# Patient Record
Sex: Female | Born: 1965 | Race: White | Hispanic: No | Marital: Married | Smoking: Former smoker
Health system: Southern US, Community
[De-identification: ages and names within clinical notes are randomized; demographics above are authoritative.]

## PROBLEM LIST (undated history)

## (undated) DIAGNOSIS — M1990 Unspecified osteoarthritis, unspecified site: Secondary | ICD-10-CM

## (undated) DIAGNOSIS — I10 Essential (primary) hypertension: Secondary | ICD-10-CM

## (undated) DIAGNOSIS — J449 Chronic obstructive pulmonary disease, unspecified: Secondary | ICD-10-CM

## (undated) HISTORY — PX: HIATAL HERNIA REPAIR: SHX195

## (undated) HISTORY — PX: LUNG SURGERY: SHX703

## (undated) HISTORY — PX: TENDON REPAIR: SHX5111

## (undated) HISTORY — PX: CHOLECYSTECTOMY: SHX55

---

## 2006-11-15 ENCOUNTER — Emergency Department (HOSPITAL_COMMUNITY): Admission: EM | Admit: 2006-11-15 | Discharge: 2006-11-15 | Payer: Self-pay | Admitting: Emergency Medicine

## 2006-11-20 ENCOUNTER — Emergency Department (HOSPITAL_COMMUNITY): Admission: EM | Admit: 2006-11-20 | Discharge: 2006-11-20 | Payer: Self-pay | Admitting: Emergency Medicine

## 2007-01-15 ENCOUNTER — Inpatient Hospital Stay (HOSPITAL_COMMUNITY): Admission: EM | Admit: 2007-01-15 | Discharge: 2007-01-16 | Payer: Self-pay | Admitting: Emergency Medicine

## 2007-02-28 ENCOUNTER — Emergency Department (HOSPITAL_COMMUNITY): Admission: EM | Admit: 2007-02-28 | Discharge: 2007-02-28 | Payer: Self-pay | Admitting: Emergency Medicine

## 2007-03-07 ENCOUNTER — Ambulatory Visit: Payer: Self-pay | Admitting: Cardiothoracic Surgery

## 2007-03-07 ENCOUNTER — Encounter: Admission: RE | Admit: 2007-03-07 | Discharge: 2007-03-07 | Payer: Self-pay | Admitting: Cardiothoracic Surgery

## 2007-03-14 ENCOUNTER — Encounter: Admission: RE | Admit: 2007-03-14 | Discharge: 2007-03-14 | Payer: Self-pay | Admitting: Cardiothoracic Surgery

## 2007-03-18 ENCOUNTER — Ambulatory Visit: Payer: Self-pay | Admitting: Cardiothoracic Surgery

## 2007-03-28 ENCOUNTER — Encounter: Admission: RE | Admit: 2007-03-28 | Discharge: 2007-03-28 | Payer: Self-pay | Admitting: Cardiothoracic Surgery

## 2007-03-28 ENCOUNTER — Ambulatory Visit: Payer: Self-pay | Admitting: Cardiothoracic Surgery

## 2007-04-04 ENCOUNTER — Ambulatory Visit: Payer: Self-pay | Admitting: Cardiothoracic Surgery

## 2007-04-09 ENCOUNTER — Ambulatory Visit: Payer: Self-pay | Admitting: Cardiothoracic Surgery

## 2007-04-09 ENCOUNTER — Encounter (INDEPENDENT_AMBULATORY_CARE_PROVIDER_SITE_OTHER): Payer: Self-pay | Admitting: Specialist

## 2007-04-09 ENCOUNTER — Inpatient Hospital Stay (HOSPITAL_COMMUNITY): Admission: RE | Admit: 2007-04-09 | Discharge: 2007-04-14 | Payer: Self-pay | Admitting: Cardiothoracic Surgery

## 2007-04-18 ENCOUNTER — Encounter: Admission: RE | Admit: 2007-04-18 | Discharge: 2007-04-18 | Payer: Self-pay | Admitting: Cardiothoracic Surgery

## 2007-04-18 ENCOUNTER — Ambulatory Visit: Payer: Self-pay | Admitting: Cardiothoracic Surgery

## 2007-09-04 ENCOUNTER — Emergency Department (HOSPITAL_COMMUNITY): Admission: EM | Admit: 2007-09-04 | Discharge: 2007-09-04 | Payer: Self-pay | Admitting: Emergency Medicine

## 2008-01-15 ENCOUNTER — Ambulatory Visit: Payer: Self-pay | Admitting: Cardiothoracic Surgery

## 2009-07-13 ENCOUNTER — Ambulatory Visit (HOSPITAL_COMMUNITY): Admission: RE | Admit: 2009-07-13 | Discharge: 2009-07-13 | Payer: Self-pay | Admitting: Family Medicine

## 2010-04-28 ENCOUNTER — Emergency Department (HOSPITAL_COMMUNITY): Admission: EM | Admit: 2010-04-28 | Discharge: 2010-04-28 | Payer: Self-pay | Admitting: Emergency Medicine

## 2011-01-21 ENCOUNTER — Encounter: Payer: Self-pay | Admitting: Cardiothoracic Surgery

## 2011-05-18 NOTE — Op Note (Signed)
NAMEMarland Kitchen  Gabriella, Vance NO.:  [id number]   MEDICAL RECORD NO.:  [id number]          PATIENT TYPE:  INP   LOCATION:  2550                         FACILITY:  [facility]   PHYSICIAN:  Kerin Perna, M.D.  DATE OF BIRTH:  1966/03/27   DATE OF PROCEDURE:  04/09/2007  DATE OF DISCHARGE:                               OPERATIVE REPORT   OPERATION:  Right VATS (video assisted thoracoscopic surgery) with wedge  resection of apical blebs and right pleurectomy, placement of wound On-Q  system.   SURGEON:  Kerin Perna, M.D.   ASSISTANT:  Coral Ceo, PA-C.   ANESTHESIA:  General.   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   INDICATIONS FOR PROCEDURE:  The patient is a 45 year old female smoker  who has had recurrent pneumothoraces including hospitalization for chest  tube at [facility].  A CT scan showed extensive blood disease  in the right apex and she has had recent recurrent pneumothorax and  pain.  It was felt that a right VATS and resection of the blebs and  pleurectomy would be indicated.  I discussed the procedure in detail  with the patient and her husband including the alternatives to surgery,  the risks of prolonged air leak, bleeding, infection and recurrent  pneumothorax.  She understood these implications and agreed to proceed  with surgery.   PROCEDURE:  The patient was brought to the operating room, placed supine  on the operating table.  General anesthesia was induced.  A double-lumen  endotracheal tube was placed and positioned by the anesthesiologist.  The patient was turned to expose the right chest which was confirmed to  be the correct site with a preoperative time-out.  The chest was prepped  and draped as a sterile field.  Three VATS portal incisions were made  anteriorly, in the area  beneath the tip of the scapula, in the posterior  right thorax.  The camera was inserted.  The lung was carefully  inspected.  There was extensive bleb disease at the apex with some  adhesions.  The superior segment of the lower lobe and middle lobe had  no significant blood disease.  There were no nodules or densities noted  or pleural disease other than the adhesions at the apex.   The apical adhesions were taken down and cauterized.  The apex was  mobilized and using the Endo-GIA stapler, a generous wedge resection of  several apical blebs was performed.  The staple line was reinforced with  CoSeal adhesive.  A second area of more medial blood disease was excised  with a separate firing of the Endo-GIA stapler. The staple lines were  inspected and found to be intact.  The remainder of the lung was  inspected and found to have no other blebs.   A pleural abrasion and then a mechanical pleurectomy of the apex of the  fifth rib was undertaken with good results.   Next, a 28 chest tube was placed through a  separate incision and  advanced to the apex and secured to the skin under direct vision with a  camera.  Next the three VATS portal incisions were closed in layers  using Vicryl.  The patient was reversed from anesthesia, extubated and  returned to the recovery room.  The Pleur-Evac had no significant air  leak after the chest was closed.   A wound On-Q irrigation system was placed between the chest tube and the  VATS incisions from the anterior to the posterior chest and secured to  the skin with a 2-0 silk suture.  It was connected to the Marcaine  reservoir.      Kerin Perna, M.D.  Electronically Signed     PV/MEDQ  D:  04/09/2007  T:  04/09/2007  Job:  [id number]

## 2011-05-18 NOTE — Op Note (Signed)
Gabriella Vance, FICEK NO.:  [id number]   MEDICAL RECORD NO.:  [id number]          PATIENT TYPE:  OBV   LOCATION:  A330                          FACILITY:  [facility]   PHYSICIAN:  Barbaraann Barthel, M.D. DATE OF BIRTH:  [birth year]   DATE OF PROCEDURE:  01/14/2007  DATE OF DISCHARGE:                               OPERATIVE REPORT   DIAGNOSIS:  Right spontaneous pneumothorax.   PROCEDURE:  Placement of right 20-French chest tube in the mid axillary  line approximately the fifth intercostal space.   NOTE:  This is a 45 year old white female who was admitted through the  emergency room with a small 5% pneumothorax.  We elected to watch her  for approximately 4 hours, repeated the chest x-ray and there was  obvious increase in the pneumothorax.  We then under local anesthesia  prepped the right mid axillary line with Betadine solution, draped this  in the usual manner and used 1% Xylocaine without epinephrine in order  to place a chest tube.  This was done by cutdown anesthetizing the  intercostal space at approximately the fifth intercostal space and I  tunneled incision and tunneled the tube and placed this in through the  pleura.  This was done with minimal discomfort.  She had some pain of  obviously up on inspiration but that this procedure was tolerated very  well.  We then checked the chest x-ray, the tube had curved, it was  going cephalad but curved in the mediastinum. I then withdrew it  approximately 4 inches as appropriate according to the chest x-ray. We  then sutured the tube in place with a #1 Prolene suture and closed the  cutdown incision with 4-0 nylon.  Sterile dressing with Vaseline gauze  and large tape was applied and the tube was connected to Pleur-Evac and  this to continuous wall suction.  The patient tolerated the procedure  well.      Barbaraann Barthel, M.D.  Electronically Signed     WB/MEDQ  D:  01/14/2007  T:  01/15/2007  Job:  [id number]

## 2011-05-18 NOTE — Discharge Summary (Signed)
Gabriella Vance, PAE NO.:  [id number]   MEDICAL RECORD NO.:  [id number]          PATIENT TYPE:  INP   LOCATION:  A330                          FACILITY:  [facility]   PHYSICIAN:  Barbaraann Barthel, M.D. DATE OF BIRTH:  [birth year]   DATE OF ADMISSION:  01/14/2007  DATE OF DISCHARGE:  01/17/2008LH                               DISCHARGE SUMMARY   DIAGNOSIS:  Spontaneous pneumothorax.   SECONDARY DIAGNOSES:  1. Tobacco abuse.  2. Irregular heart rhythm.   PROCEDURES PERFORMED:  January 14, 2007:  Placement of 20-French chest  tube in right pleural cavity.   This is a 45 year old thin, heavy smoker, white female who presented to  the emergency room in respiratory distress; was noted to have a small  basilar pneumothorax on the right side.  This was observed for  approximately 4 hours.  A Followup chest x-ray revealed an increase in  the pneumothorax.  At this point, a 20-French chest tube was placed in  the emergency room and the patient was admitted for observation.  The  following day, a chest x-ray revealed resolution of the basal  pneumothorax on the right side and very small apical pneumothorax.  We  then kept her to wall suction another day and the following day this had  completely resolved.  Clinically, there was no air leak noted on the  pleurovac and the chest x-ray was read on January 16, 2007, as complete  resolution of the pneumothoraces.  I should state that the chest x-ray  showed pretty extensive blebs noted bilaterally.   The wound was cleaned and redressed after the removal of the chest tube  and the patient was discharged with instructions that she was excused  from heavy lifting and for work at present time.  I will follow her in a  week's time.  She is told to go to the emergency room either at [facility] or [facility].  If at [facility], to ask for Dr.  Edwyna Shell since we had discussed the possibility that she may be a future  candidate for stapling procedure of her pulmonary blebs.  At any rate,  she is told to report to the emergency room should she experience any  acute respiratory changes.   She was advised to stop smoking.  She was offered a prescription for  Chantix, she refused this.  She will follow up with [facility] if she should change her mind.  I will follow her for  suture removal and she is returned to her medical physicians at [facility].   LABORATORY DATA:  White count was 10.2 with an H&H of 14.7 and 42.4.  Her electrolytes were all grossly within normal limits.  Urinalysis was  also grossly within normal limits.  Chest x-rays as mentioned above.  Final x-ray showing resolution of right pneumothorax.   HOSPITAL COURSE:  As mentioned above, uneventful.  She was covered while  she was in the hospital while chest tube in place with daily Rocephin  and her pain medicines were controlled as needed with a  PCA pump.   DISCHARGE MEDICATIONS:  She is discharged on no pain medicines.  Take  Tylenol as needed for pain.   DISCHARGE INSTRUCTIONS:  The rest of her discharge instructions as  follows:  She is excused from work.  She is told to increase her  activity as tolerated.  She may go up and down the stairs.  She is told  to do no heavy lifting, no driving, no vigorous sexual activity.  She is  told to clean her wound daily with alcohol and apply Neosporin and 4x4  as needed.   DISCHARGE DIET:  Diet is without restriction.   DISCHARGE FOLLOWUP:  She is to report to my office on January 23, 2007,  at 10:00 a.m.  Told to go to the emergency room should she experience  any respiratory problems prior to this.  She is strongly encouraged to  stop smoking.   She is told to resume her preoperative medicines which include which  include atenolol 25 mg p.o. daily.  She also takes Allegra 180 mg p.r.n.  her problems with seasonal allergies.      Barbaraann Barthel,  M.D.  Electronically Signed     WB/MEDQ  D:  01/16/2007  T:  01/16/2007  Job:  [id number]   cc:   Barbaraann Barthel, M.D.  Fax: [phone]   Ines Bloomer, M.D.  [address]  [place]  [place] [address]   [facility]

## 2011-05-18 NOTE — H&P (Signed)
NAME:  Gabriella Vance, Gabriella Vance NO.:  [id number]   MEDICAL RECORD NO.:  [id number]          PATIENT TYPE:  OBV   LOCATION:  A330                          FACILITY:  [facility]   PHYSICIAN:  Barbaraann Barthel, M.D. DATE OF BIRTH:  18-Oct-1966   DATE OF ADMISSION:  01/14/2007  DATE OF DISCHARGE:  LH                              HISTORY & PHYSICAL   HISTORY AND PHYSICAL:  This is the first incidence of a pneumothorax in  this patient who smokes at least a pack of cigarettes per day and has  done so for many years.   PHYSICAL EXAMINATION:  VITAL SIGNS:  Temperature is 98.1.  Her blood  pressure as 110/60, pulse rate is between 51 at 67 beats per minute with  respirations 22 per minute.  Her O2 saturation has remained between 98  and 100% on 2 liters of nasal cannula oxygen.  She is 5 feet 10 inches  and weighs 125 pounds.  HEENT:  Head is normocephalic.  EYES:  Extraocular movements intact.  Pupils were round and react to light and accommodation.  There is no  conjunctive pallor or scleral injection.  Sclerae is normal tincture.  Nose and oral mucosa are moist.  Neck is supple and cylindrical without  jugular vein distension, thyromegaly or tracheal deviation.  There are  no bruits auscultated.  There is no adenopathy.  CHEST:  Diminished breath sounds and the patient is obviously stenting  on examination.  HEART:  Regular rhythm. Breast and axilla without masses.  ABDOMEN:  Soft.  The patient has previously undergone a laparoscopic  hiatal hernia repair with simultaneous laparoscopic gallbladder.  She  has had no other surgeries.  RECTAL:  PELVIC:  Examination was deferred.  EXTREMITIES:  Within normal limits.   REVIEW OF SYSTEMS:  OB/GYN SYSTEM:  The patient is a gravida 2, para 2,  abortus 0, cesarean 0 female with no family history of carcinoma of the  breast and she has had no mammogram.  GU:  No history of nephrolithiasis  or dysuria.  CARDIOVASCULAR:  The patient smokes at  least a pack of  cigarettes per day and takes Tenormin for an irregular heart beat.  She  does take Allegra for some seasonal allergies.  ENDOCRINE:  No history  diabetes or thyroid disease in the past.  MUSCULOSKELETAL:  Grossly  within normal limits.  NEURO:  System grossly within normal limits.   LABORATORY DATA:  Chest x-ray has shown as mentioned above the 5%  pneumothorax on admission which increased.   Her white count is 10.2 with an H&H of 14.7, 42.4.  Platelets of  277,000. Metabolic-7 is grossly within normal limits.  Urinalysis  grossly within normal limits.   MEDICATIONS:  1. Tenormin 25 mg daily.  2. Allegra 180 mg daily for her seasonal allergies.   ALLERGIES:  She has no known allergies to other medications and as  stated smokes a pack of cigarettes a day.   IMPRESSION:  Spontaneous pneumothorax first episode.   PLAN:  She has been admitted, #20-French chest tube was placed.  We will  follow  up with a chest x-ray in the morning.  I will cover with Rocephin  1 gram daily.  I have discussed this procedure with Dr. Edwyna Shell who may  very well end up seeing her in the future.      Barbaraann Barthel, M.D.  Electronically Signed     WB/MEDQ  D:  01/14/2007  T:  01/15/2007  Job:  [id number]   cc:   Ines Bloomer, M.D.  [address]  [place]  [place] [address]

## 2011-05-18 NOTE — Discharge Summary (Signed)
Gabriella Vance, Gabriella Vance NO.:  [id number]   MEDICAL RECORD NO.:  [id number]          PATIENT TYPE:  INP   LOCATION:  3305                         FACILITY:  [facility]   PHYSICIAN:  Kerin Perna, M.D.  DATE OF BIRTH:  1966-05-09   DATE OF ADMISSION:  04/09/2007  DATE OF DISCHARGE:                               DISCHARGE SUMMARY   ADMISSION DIAGNOSIS:  Right lung apical blebs with recurrent spontaneous  pneumothorax.   DISCHARGE/SECONDARY DIAGNOSES:  1. Right lung apical blebs with recurrent spontaneous pneumothorax      status post right VATS bleb resection and pleurectomy.  2. Postoperative right lung infiltrate improved with Zinacef.  3. Stable 15% right pneumothorax postoperatively.   PROCEDURE:  On April 09, 2007 right video-assisted thorascopic surgery  with wedge resection of apical blebs and right pleurectomy by Dr. Kathlee Nations Trigt.   ALLERGIES:  LATEX ALLERGY.   BRIEF HISTORY:  The patient is a 45 year old Caucasian female smoker who  had recurrent spontaneous right pneumothorax.  She initially presented  to [facility] in January and a chest tube was placed for a  pneumothorax.  She returned and was evaluated in the office in early  March where exam showed no evidence of pneumothorax as confirmed by  chest x-ray.  Ten days later she had recurrent pleuritic chest pain and  x-ray did show small pneumothorax on the right.  Subsequently, CT scan  was performed which showed bilateral apical blebs with some bilateral  pulmonary nodules measuring 3 mm, probably inflammatory.  Because of the  small recurrent pneumothorax and known blebs per CT scan, she was  scheduled for a resection of the blebs via right VATS procedure.   HOSPITAL COURSE:  Ms. Geraghty was electively admitted to [facility] on April 09, 2007.  She was taken to the operating room by Dr.  Donata Clay on April 09, 2007 for the above mentioned procedure.  Intraoperatively, an  _______On-Q___ catheter was inserted for pain  management and was discontinued by postoperative day 3.  She also was on  a Dilaudid PCA for pain management which was discontinued after her  chest tubes were out.  Pain was then controlled on Norco p.r.n.  Chest  tube output was minimal after surgery.  She did have a small air leak  initially managed with suction.  She also showed a 15% pneumothorax;  however, the air leak resolved and the chest x-ray remained stable  without suction and subsequently her chest tube was removed on April 13, 2007.  Her followup chest x-ray appeared stable.  Of note, she was  covered with four doses of Zinacef for right lung infiltrate which  appeared to be improving.  She remained afebrile with stable vital  signs.  Oxygen saturation was 98% on room air.  The patient was  tolerating food and making progress with mobility.  If her followup  chest x-ray appears stable on April 14th and there are no significant  changes in her status as anticipated, she will be discharged home on  postoperative day 5, April 14, 2007.  LABORATORY DATA:  Shows a hemoglobin of 9.6, hematocrit of 32.9,  platelet count 224.  Sodium 138, potassium 4.3, chloride 101, CO2 29,  BUN 5, creatinine 0.51, glucose 95.  LFTs were normal.  Pathology showed  benign lung parenchyma with subpleural blebs and emphysematous changes.   DISCHARGE MEDICATIONS:  1. Atenolol 25 mg p.o. daily.  2. Norco 5/325 mg 1-2 tablets p.o. q. 4 hours p.r.n. pain.   DISCHARGE INSTRUCTIONS:  She is to continue daily walking, breathing  exercises.  She should increase her activity slowly.  She may shower and  clean her incision gently with soap and water.  Should call if she  develops fever greater than 101, redness, or drainage from her incision  sites, or increased shortness of breath.  She should avoid driving or  heavy lifting for the next two weeks and should see Dr. Donata Clay at  [facility] office in 1-2 weeks  with a followup chest x-ray.  Our office will  contact her regarding specific appointment date and time.      Jerold Coombe, P.A.      Kerin Perna, M.D.  Electronically Signed    AWZ/MEDQ  D:  04/13/2007  T:  04/13/2007  Job:  [id number]   cc:   Kerin Perna, M.D.

## 2011-05-18 NOTE — Consult Note (Signed)
Gabriella Vance, Gabriella Vance NO.:  [id number]   MEDICAL RECORD NO.:  [id number]          PATIENT TYPE:  INP   LOCATION:  A330                          FACILITY:  [facility]   PHYSICIAN:  Barbaraann Barthel, M.D. DATE OF BIRTH:  03/19/1966   DATE OF CONSULTATION:  01/14/2007  DATE OF DISCHARGE:                                 CONSULTATION   EMERGENCY ROOM CONSULTATION:   NOTE:  Surgery was asked to see this 45 year old white female with  sudden onset of chest pain.  She came to the emergency room with those  complaints and some pain on inspiration.  A chest x-ray showed a 5%  small pneumothorax that was loculated in the right basal area.  She was  otherwise stable.  Her blood pressure was 110/60, heart rate 51,  respirations 22 and her O2 saturation was 100% on 2 liters of nasal  cannula.   CONTRIBUTING PAST MEDICAL HISTORY:  She is a heavy smoker, smokes over a  pack of cigarettes per day and has done so for many years, and this is  her first instance of a pneumothorax.  Chest x-ray were also reveals  blebs present in the apical areas of the left chest, as well.   I have discussed this with the radiologist, as well as Dr. Edwyna Shell, and  we agree that with an observation in this patient is warranted.  We  obtained the first chest x-ray around 11 o'clock; we will obtain another  one at 3 o'clock, and if this is unchanged, we will have the patient  return to the emergency room if there are any acute respiratory changes  or and in the a.m. for a follow-up chest x-ray.  If this increases, I  will put a chest tube in and observe this and likely make a referral to  Dr. Edwyna Shell if this does not improve with this treatment.      Barbaraann Barthel, M.D.  Electronically Signed     WB/MEDQ  D:  01/14/2007  T:  01/14/2007  Job:  [id number]   cc:   Ines Bloomer, M.D.  [address]  [place]  [place] [address]
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
## 2012-12-02 ENCOUNTER — Emergency Department (HOSPITAL_COMMUNITY)
Admission: EM | Admit: 2012-12-02 | Discharge: 2012-12-02 | Disposition: A | Discharge status: Home / Self Care | Payer: Self-pay | Attending: Emergency Medicine | Admitting: Emergency Medicine

## 2012-12-02 ENCOUNTER — Encounter (HOSPITAL_COMMUNITY): Payer: Self-pay | Admitting: *Deleted

## 2012-12-02 ENCOUNTER — Emergency Department (HOSPITAL_COMMUNITY): Payer: Self-pay

## 2012-12-02 DIAGNOSIS — Z7982 Long term (current) use of aspirin: Secondary | ICD-10-CM | POA: Insufficient documentation

## 2012-12-02 DIAGNOSIS — R509 Fever, unspecified: Secondary | ICD-10-CM | POA: Insufficient documentation

## 2012-12-02 DIAGNOSIS — Z87891 Personal history of nicotine dependence: Secondary | ICD-10-CM | POA: Insufficient documentation

## 2012-12-02 DIAGNOSIS — J029 Acute pharyngitis, unspecified: Secondary | ICD-10-CM | POA: Insufficient documentation

## 2012-12-02 DIAGNOSIS — I10 Essential (primary) hypertension: Secondary | ICD-10-CM | POA: Insufficient documentation

## 2012-12-02 DIAGNOSIS — IMO0001 Reserved for inherently not codable concepts without codable children: Secondary | ICD-10-CM | POA: Insufficient documentation

## 2012-12-02 DIAGNOSIS — J3489 Other specified disorders of nose and nasal sinuses: Secondary | ICD-10-CM | POA: Insufficient documentation

## 2012-12-02 DIAGNOSIS — J40 Bronchitis, not specified as acute or chronic: Secondary | ICD-10-CM | POA: Insufficient documentation

## 2012-12-02 DIAGNOSIS — Z9889 Other specified postprocedural states: Secondary | ICD-10-CM | POA: Insufficient documentation

## 2012-12-02 DIAGNOSIS — Z79899 Other long term (current) drug therapy: Secondary | ICD-10-CM | POA: Insufficient documentation

## 2012-12-02 HISTORY — DX: Essential (primary) hypertension: I10

## 2012-12-02 MED ORDER — ALBUTEROL SULFATE HFA 108 (90 BASE) MCG/ACT IN AERS
2.0000 | INHALATION_SPRAY | Freq: Once | RESPIRATORY_TRACT | Status: AC
  Administered 2012-12-02: 2 via RESPIRATORY_TRACT
  Filled 2012-12-02: qty 6.7

## 2012-12-02 MED ORDER — AZITHROMYCIN 250 MG PO TABS
500.0000 mg | ORAL_TABLET | Freq: Once | ORAL | Status: AC
  Administered 2012-12-02: 500 mg via ORAL
  Filled 2012-12-02: qty 2

## 2012-12-02 MED ORDER — AZITHROMYCIN 250 MG PO TABS: ORAL_TABLET | ORAL | Status: DC

## 2012-12-02 MED ORDER — GUAIFENESIN-CODEINE 100-10 MG/5ML PO SYRP: 10.0000 mL | ORAL_SOLUTION | Freq: Three times a day (TID) | ORAL | Status: AC | PRN

## 2012-12-02 NOTE — ED Notes (Signed)
Cough, fever, runny nose, sore throat, sputum is yellow,Body aches

## 2012-12-03 NOTE — ED Provider Notes (Signed)
History     CSN: [id number]  Arrival date & time 12/02/12  1404   First MD Initiated Contact with Patient 12/02/12 1438      Chief Complaint  Patient presents with  . Cough    (Consider location/radiation/quality/duration/timing/severity/associated sxs/prior treatment) Patient is a 46 y.o. female presenting with cough. The history is provided by the patient.  Cough This is a new problem. The current episode started more than 2 days ago. The problem occurs constantly. The problem has been gradually worsening. The cough is productive of purulent sputum. The maximum temperature recorded prior to her arrival was 100 to 100.9 F. The fever has been present for 1 to 2 days. Associated symptoms include chills, sweats, rhinorrhea, sore throat and myalgias. Pertinent negatives include no chest pain, no ear pain, no headaches, no shortness of breath and no wheezing. She has tried nothing for the symptoms. The treatment provided no relief. Smoker: former smoker. Her past medical history does not include pneumonia or asthma. Past medical history comments: previous lung surgery.    Past Medical History  Diagnosis Date  . Hypertension     Past Surgical History  Procedure Date  . Lung surgery     History reviewed. No pertinent family history.  History  Substance Use Topics  . Smoking status: Former Games developer  . Smokeless tobacco: Not on file  . Alcohol Use: No    OB History    Grav Para Term Preterm Abortions TAB SAB Ect Mult Living                  Review of Systems  Constitutional: Positive for fever and chills. Negative for activity change and appetite change.  HENT: Positive for congestion, sore throat and rhinorrhea. Negative for ear pain, facial swelling, trouble swallowing, neck pain and neck stiffness.   Eyes: Negative for visual disturbance.  Respiratory: Positive for cough. Negative for chest tightness, shortness of breath, wheezing and stridor.   Cardiovascular: Negative for  chest pain.  Gastrointestinal: Negative for nausea and vomiting.  Genitourinary: Negative for dysuria.  Musculoskeletal: Positive for myalgias.  Skin: Negative.  Negative for rash.  Neurological: Negative for dizziness, weakness, numbness and headaches.  Hematological: Negative for adenopathy.  Psychiatric/Behavioral: Negative for confusion.  All other systems reviewed and are negative.    Allergies  Latex  Home Medications   Current Outpatient Rx  Name  Route  Sig  Dispense  Refill  . ACETAMINOPHEN 500 MG PO TABS   Oral   Take 1,000 mg by mouth every 6 (six) hours as needed. Pain         . ASPIRIN EC 81 MG PO TBEC   Oral   Take 81 mg by mouth daily.         . ATENOLOL 25 MG PO TABS   Oral   Take 25 mg by mouth daily.         Marland Kitchen DEXTROMETHORPHAN HBR 15 MG/5ML PO SYRP   Oral   Take 15 mLs by mouth 4 (four) times daily as needed. Cough         . IBUPROFEN 200 MG PO TABS   Oral   Take 600 mg by mouth every 6 (six) hours as needed. Pain         . ADULT MULTIVITAMIN W/MINERALS CH   Oral   Take 1 tablet by mouth daily.         . AZITHROMYCIN 250 MG PO TABS      Take two  tablets on day one, then one tab qd days 2-5   6 tablet   0   . GUAIFENESIN-CODEINE 100-10 MG/5ML PO SYRP   Oral   Take 10 mLs by mouth 3 (three) times daily as needed for cough.   120 mL   0     BP 105/88  Pulse 96  Temp 99.5 F (37.5 C) (Oral)  Resp 20  Ht 5\' 10"  (1.778 m)  Wt 180 lb (81.647 kg)  BMI 25.83 kg/m2  SpO2 98%  Physical Exam  Nursing note and vitals reviewed. Constitutional: She is oriented to person, place, and time. She appears well-developed and well-nourished. No distress.  HENT:  Head: Normocephalic and atraumatic.  Mouth/Throat: Oropharynx is clear and moist.  Neck: Normal range of motion. Neck supple.  Cardiovascular: Normal rate, regular rhythm, normal heart sounds and intact distal pulses.   No murmur heard. Pulmonary/Chest: Effort normal and  breath sounds normal. No respiratory distress. She has no rales.       Few expiratory wheezes and carse lung sounds bilaterally.  No rales   Abdominal: Soft.  Musculoskeletal: She exhibits no edema.  Neurological: She is alert and oriented to person, place, and time. She exhibits normal muscle tone. Coordination normal.    ED Course  Procedures (including critical care time)  Labs Reviewed - No data to display Dg Chest 2 View  12/02/2012  *RADIOLOGY REPORT*  Clinical Data: Cough  CHEST - 2 VIEW  Comparison: 09/04/2007  Findings: Cardiomediastinal silhouette is stable.  Postsurgical changes in the right upper lobe.  No acute infiltrate or pleural effusion.  No pulmonary edema.  Bony thorax is unremarkable.  IMPRESSION: No active disease.  Stable postsurgical changes in the right upper lobe.   Original Report Authenticated By: Natasha Mead, M.D.      1. Bronchitis       MDM     Vitals are stable,  Lung sounds improved after neb, pt feeling better.  Non-toxic appearing.  No tachypnea, tachycardia or hypoxia to suggest PE.   Albuterol MDI dispensed in ED and given first dose of Zithromax.  Pt agrees to f/u with her PMD or return here if sx's worsen.  prescribed   Robitussin AC zithromax    Mika Griffitts L. [place], [place] 12/03/12 2133

## 2012-12-05 NOTE — ED Provider Notes (Signed)
Medical screening examination/treatment/procedure(s) were performed by non-physician practitioner and as supervising physician I was immediately available for consultation/collaboration. Kal Chait, MD, FACEP   Rashea Hoskie L Okechukwu Regnier, MD 12/05/12 2119 

## 2013-09-10 ENCOUNTER — Emergency Department (HOSPITAL_COMMUNITY)
Admission: EM | Admit: 2013-09-10 | Discharge: 2013-09-10 | Disposition: A | Discharge status: Home / Self Care | Payer: Self-pay | Attending: Emergency Medicine | Admitting: Emergency Medicine

## 2013-09-10 ENCOUNTER — Encounter (HOSPITAL_COMMUNITY): Payer: Self-pay | Admitting: Adult Health

## 2013-09-10 DIAGNOSIS — Z87891 Personal history of nicotine dependence: Secondary | ICD-10-CM | POA: Insufficient documentation

## 2013-09-10 DIAGNOSIS — Z91040 Latex allergy status: Secondary | ICD-10-CM | POA: Insufficient documentation

## 2013-09-10 DIAGNOSIS — Z79899 Other long term (current) drug therapy: Secondary | ICD-10-CM | POA: Insufficient documentation

## 2013-09-10 DIAGNOSIS — I10 Essential (primary) hypertension: Secondary | ICD-10-CM | POA: Insufficient documentation

## 2013-09-10 DIAGNOSIS — L0100 Impetigo, unspecified: Secondary | ICD-10-CM | POA: Insufficient documentation

## 2013-09-10 DIAGNOSIS — Z792 Long term (current) use of antibiotics: Secondary | ICD-10-CM | POA: Insufficient documentation

## 2013-09-10 DIAGNOSIS — Z7982 Long term (current) use of aspirin: Secondary | ICD-10-CM | POA: Insufficient documentation

## 2013-09-10 MED ORDER — MUPIROCIN CALCIUM 2 % EX CREA: TOPICAL_CREAM | Freq: Three times a day (TID) | CUTANEOUS | Status: DC

## 2013-09-10 NOTE — ED Provider Notes (Signed)
CSN: [id number]     Arrival date & time 09/10/13  1503 History   This chart was scribed for non-physician practitioner Magnus Sinning, PA-C,working with Glynn Octave, MD by Dorothey Baseman, ED Scribe. This patient was seen in room TR06C/TR06C and the patient's care was started at 5:12 PM.    Chief Complaint  Patient presents with  . Rash    The history is provided by the patient. No language interpreter was used.   HPI Comments: Gabriella Vance is a 47 y.o. female who presents to the Emergency Department complaining of a rash that began on the right side of the upper lip onset 5 days ago that has since spread to her forehead and nose with associated soreness, but without drainage. She states she has washed the area with salt water at home, which seems to aggravate the area, making it more itchy. She states the rash appears to be drying up and improving at this time. [facility]  Patient reports sore throat secondary to a recent diagnosis of strep throat that has been improving after being seen at a clinic and was given a prescription of  Doxycycline, and Amoxicillin. Patient denies associated fever, chills, nausea, vomiting, or any other symptoms at this time.   Past Medical History  Diagnosis Date  . Hypertension    Past Surgical History  Procedure Laterality Date  . Lung surgery     History reviewed. No pertinent family history. History  Substance Use Topics  . Smoking status: Former Games developer  . Smokeless tobacco: Not on file  . Alcohol Use: No   OB History   Grav Para Term Preterm Abortions TAB SAB Ect Mult Living                 Review of Systems  A complete 10 system review of systems was obtained and all systems are negative except as noted in the HPI and PMH.   Allergies  Latex  Home Medications   Current Outpatient Rx  Name  Route  Sig  Dispense  Refill  . acetaminophen (TYLENOL) 500 MG tablet   Oral    Take 1,000 mg by mouth every 6 (six) hours as needed. Pain         . amoxicillin (AMOXIL) 875 MG tablet   Oral   Take 875 mg by mouth 2 (two) times daily.         Marland Kitchen aspirin EC 81 MG tablet   Oral   Take 81 mg by mouth daily.         Marland Kitchen atenolol (TENORMIN) 25 MG tablet   Oral   Take 25 mg by mouth daily.         Marland Kitchen doxycycline (VIBRA-TABS) 100 MG tablet   Oral   Take 100 mg by mouth 2 (two) times daily.         Marland Kitchen ibuprofen (ADVIL,MOTRIN) 200 MG tablet   Oral   Take 600 mg by mouth every 6 (six) hours as needed. Pain         . Multiple Vitamin (MULTIVITAMIN WITH MINERALS) TABS   Oral   Take 1 tablet by mouth daily.         . valACYclovir (VALTREX) 1000 MG tablet   Oral   Take 1,000 mg by mouth 2 (two) times daily.         Marland Kitchen azithromycin (ZITHROMAX Z-PAK) 250 MG tablet  Take two tablets on day one, then one tab qd days 2-5   6 tablet   0    Triage Vitals: BP 162/76  Pulse 105  Temp(Src) 98.7 F (37.1 C) (Oral)  Resp 16  SpO2 99%  Physical Exam  Nursing note and vitals reviewed. Constitutional: She is oriented to person, place, and time. She appears well-developed and well-nourished. No distress.  HENT:  Head: Normocephalic and atraumatic.  Mouth/Throat: Oropharynx is clear and moist.  Eyes: Conjunctivae are normal. Pupils are equal, round, and reactive to light.  Neck: Normal range of motion. Neck supple.  Cardiovascular: Normal rate, regular rhythm and normal heart sounds.   Pulmonary/Chest: Effort normal and breath sounds normal. No respiratory distress.  Musculoskeletal: Normal range of motion.  Neurological: She is alert and oriented to person, place, and time.  Skin: Skin is warm and dry. Rash noted.  3 scabbed areas with honey-colored crusting on an erythematous base to the right naso-labia area, left upper nose medial to the left eyebrow, and on the left upper forehead.  Psychiatric: She has a normal mood and affect. Her behavior is  normal.    ED Course  Procedures (including critical care time)  DIAGNOSTIC STUDIES: Oxygen Saturation is 99% on room air, normal by my interpretation.    COORDINATION OF CARE: 5:18PM- Discussed possible diagnosis of Impetigo and will discharge patient with Bactroban topical antibiotic. Advised patient to follow up with her PCP if there are any new or worsening symptoms. Discussed treatment plan with patient at bedside and patient verbalized agreement.     Labs Review Labs Reviewed - No data to display Imaging Review No results found.  MDM  No diagnosis found. Patient presenting with a rash.  Rash most consistent with the appearance of Impetigo.  Patient given prescription for Bactroban.  Patient instructed to follow up with PCP if the rash does not improve.  I personally performed the services described in this documentation, which was scribed in my presence. The recorded information has been reviewed and is accurate.    Pascal Lux [place], PA-C 09/16/13 [phone]

## 2013-09-10 NOTE — ED Notes (Addendum)
Presents with sore to left side of upper lip, bridge of nose and right upper forehead, began as small vesicles and have increased in size. Recently started on amoxicillin, Doxycycline and Valacyclovir for a strep throat and respiratory virus on Monday. Sores are scabbed over. Reports throat feels better. Denies fever.  Rash does not hurt.

## 2013-09-16 NOTE — ED Provider Notes (Signed)
Medical screening examination/treatment/procedure(s) were performed by non-physician practitioner and as supervising physician I was immediately available for consultation/collaboration.   Glynn Octave, MD 09/16/13 [phone]

## 2014-08-24 ENCOUNTER — Emergency Department (HOSPITAL_COMMUNITY)
Admission: EM | Admit: 2014-08-24 | Discharge: 2014-08-24 | Disposition: A | Discharge status: Home / Self Care | Payer: Self-pay | Attending: Emergency Medicine | Admitting: Emergency Medicine

## 2014-08-24 ENCOUNTER — Emergency Department (HOSPITAL_COMMUNITY): Payer: Self-pay

## 2014-08-24 ENCOUNTER — Encounter (HOSPITAL_COMMUNITY): Payer: Self-pay | Admitting: Emergency Medicine

## 2014-08-24 DIAGNOSIS — R079 Chest pain, unspecified: Secondary | ICD-10-CM | POA: Insufficient documentation

## 2014-08-24 DIAGNOSIS — I10 Essential (primary) hypertension: Secondary | ICD-10-CM | POA: Insufficient documentation

## 2014-08-24 DIAGNOSIS — R059 Cough, unspecified: Secondary | ICD-10-CM | POA: Insufficient documentation

## 2014-08-24 DIAGNOSIS — Z7982 Long term (current) use of aspirin: Secondary | ICD-10-CM | POA: Insufficient documentation

## 2014-08-24 DIAGNOSIS — Z79899 Other long term (current) drug therapy: Secondary | ICD-10-CM | POA: Insufficient documentation

## 2014-08-24 DIAGNOSIS — Z87891 Personal history of nicotine dependence: Secondary | ICD-10-CM | POA: Insufficient documentation

## 2014-08-24 DIAGNOSIS — R05 Cough: Secondary | ICD-10-CM | POA: Insufficient documentation

## 2014-08-24 DIAGNOSIS — Z91040 Latex allergy status: Secondary | ICD-10-CM | POA: Insufficient documentation

## 2014-08-24 LAB — CBC WITH DIFFERENTIAL/PLATELET
BASOS ABS: 0 10*3/uL (ref 0.0–0.1)
BASOS PCT: 1 % (ref 0–1)
Eosinophils Absolute: 0.3 10*3/uL (ref 0.0–0.7)
Eosinophils Relative: 5 % (ref 0–5)
HEMATOCRIT: 37.6 % (ref 36.0–46.0)
HEMOGLOBIN: 12.8 g/dL (ref 12.0–15.0)
LYMPHS PCT: 25 % (ref 12–46)
Lymphs Abs: 1.5 10*3/uL (ref 0.7–4.0)
MCH: 32.1 pg (ref 26.0–34.0)
MCHC: 34 g/dL (ref 30.0–36.0)
MCV: 94.2 fL (ref 78.0–100.0)
MONO ABS: 0.5 10*3/uL (ref 0.1–1.0)
Monocytes Relative: 8 % (ref 3–12)
NEUTROS ABS: 3.7 10*3/uL (ref 1.7–7.7)
NEUTROS PCT: 61 % (ref 43–77)
Platelets: 273 10*3/uL (ref 150–400)
RBC: 3.99 MIL/uL (ref 3.87–5.11)
RDW: 12.4 % (ref 11.5–15.5)
WBC: 6 10*3/uL (ref 4.0–10.5)

## 2014-08-24 LAB — D-DIMER, QUANTITATIVE (NOT AT ARMC): D DIMER QUANT: 0.32 ug{FEU}/mL (ref 0.00–0.48)

## 2014-08-24 LAB — TROPONIN I

## 2014-08-24 LAB — BASIC METABOLIC PANEL
ANION GAP: 15 (ref 5–15)
BUN: 10 mg/dL (ref 6–23)
CHLORIDE: 99 meq/L (ref 96–112)
CO2: 27 mEq/L (ref 19–32)
Calcium: 9.7 mg/dL (ref 8.4–10.5)
Creatinine, Ser: 0.63 mg/dL (ref 0.50–1.10)
Glucose, Bld: 89 mg/dL (ref 70–99)
POTASSIUM: 4.2 meq/L (ref 3.7–5.3)
SODIUM: 141 meq/L (ref 137–147)

## 2014-08-24 MED ORDER — OXYCODONE-ACETAMINOPHEN 5-325 MG PO TABS: 1.0000 | ORAL_TABLET | Freq: Four times a day (QID) | ORAL | Status: DC | PRN

## 2014-08-24 NOTE — ED Notes (Signed)
Pt reports superior lung pain on left side since 3 days.  Pt reports SOB upon certain movements.  Pt had lobectomy on right side 9 years ago.

## 2014-08-24 NOTE — ED Provider Notes (Signed)
CSN: [id number]     Arrival date & time 08/24/14  1603 History   First MD Initiated Contact with Patient 08/24/14 1714     Chief Complaint  Patient presents with  . Cough     (Consider location/radiation/quality/duration/timing/severity/associated sxs/prior Treatment) Patient is a 48 y.o. female presenting with cough. The history is provided by the patient.  Cough Associated symptoms: chest pain   Associated symptoms: no headaches, no rash and no shortness of breath    patient has had left-sided chest pain for last 3 days. It is constant. It is worse with moving her left arm and with certain positions. No trauma. No cough. She states she's had a previous collapsed lung on the other side a she's worried that she has. She also has family members have had heart attacks in their 23s and 44s. No fevers. No abdominal pain. No nausea. No diaphoresis. She's a former smoker. She smokes one pack a day for 30 years. She's a chronic cough she states is unchanged. She states the pain is somewhat sharp and goes from her left chest to the back.  Past Medical History  Diagnosis Date  . Hypertension    Past Surgical History  Procedure Laterality Date  . Lung surgery    . Cholecystectomy    . Hiatal hernia repair    . Tendon repair     No family history on file. History  Substance Use Topics  . Smoking status: Former Games developer  . Smokeless tobacco: Not on file  . Alcohol Use: No   OB History   Grav Para Term Preterm Abortions TAB SAB Ect Mult Living                 Review of Systems  Constitutional: Negative for activity change and appetite change.  Eyes: Negative for pain.  Respiratory: Positive for cough. Negative for chest tightness and shortness of breath.   Cardiovascular: Positive for chest pain. Negative for leg swelling.  Gastrointestinal: Negative for nausea, vomiting, abdominal pain and diarrhea.  Genitourinary: Negative for flank pain.  Musculoskeletal: Negative for back pain and  neck stiffness.  Skin: Negative for rash.  Neurological: Negative for weakness, numbness and headaches.  Psychiatric/Behavioral: Negative for behavioral problems.      Allergies  Latex  Home Medications   Prior to Admission medications   Medication Sig Start Date End Date Taking? Authorizing Provider  aspirin EC 81 MG tablet Take 81 mg by mouth daily.   Yes Historical Provider, MD  atenolol (TENORMIN) 25 MG tablet Take 25 mg by mouth daily.   Yes Historical Provider, MD  Multiple Vitamin (MULTIVITAMIN WITH MINERALS) TABS Take 1 tablet by mouth daily.   Yes Historical Provider, MD  acetaminophen (TYLENOL) 500 MG tablet Take 1,000 mg by mouth every 6 (six) hours as needed. Pain    Historical Provider, MD  ibuprofen (ADVIL,MOTRIN) 200 MG tablet Take 600 mg by mouth every 6 (six) hours as needed. Pain    Historical Provider, MD  oxyCODONE-acetaminophen (PERCOCET/ROXICET) 5-325 MG per tablet Take 1-2 tablets by mouth every 6 (six) hours as needed for severe pain. 08/24/14   Juliet Rude. Benicia Bergevin, MD   BP 140/70  Pulse 89  Temp(Src) 98.2 F (36.8 C) (Oral)  Resp 20  Ht  (1.778 m)  Wt 180 lb (81.647 kg)  BMI 25.83 kg/m2  SpO2 97% Physical Exam  Nursing note and vitals reviewed. Constitutional: She is oriented to person, place, and time. She appears well-developed and well-nourished.  HENT:  Head: Normocephalic and atraumatic.  Eyes: EOM are normal. Pupils are equal, round, and reactive to light.  Neck: Normal range of motion. Neck supple.  Cardiovascular: Normal rate, regular rhythm and normal heart sounds.   No murmur heard. Pulmonary/Chest: Effort normal and breath sounds normal. No respiratory distress. She has no wheezes. She has no rales. She exhibits tenderness.  Moderate tenderness to left chest worse lower chest laterally. No rash. No Crepitance or deformity  Abdominal: Soft. Bowel sounds are normal. She exhibits no distension. There is no tenderness. There is no rebound  and no guarding.  Musculoskeletal: Normal range of motion.  Neurological: She is alert and oriented to person, place, and time. No cranial nerve deficit.  Skin: Skin is warm and dry.  Psychiatric: She has a normal mood and affect. Her speech is normal.    ED Course  Procedures (including critical care time) Labs Review Labs Reviewed  D-DIMER, QUANTITATIVE  CBC WITH DIFFERENTIAL  TROPONIN I  BASIC METABOLIC PANEL    Imaging Review Dg Chest 2 View  08/24/2014   CLINICAL DATA:  Cough. 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  EXAM: CHEST  2 VIEW  COMPARISON:  Two-view chest x-ray 12/02/2012, 09/04/2007, 04/18/2007. CT chest 07/13/2009.  FINDINGS: Cardiomediastinal silhouette unremarkable, unchanged. Bullous emphysematous changes in the upper lobes with surgical suture material in the right apex. Linear scarring in the lower lobes bilaterally. Mild central peribronchial thickening, unchanged. Lungs otherwise clear. No localized airspace consolidation. No pleural effusions. No pneumothorax. Normal pulmonary vascularity. Visualized bony thorax intact.  IMPRESSION: Severe COPD/emphysema. Scarring in the lower lobes. No acute cardiopulmonary disease.   Electronically Signed   By: Hulan Saas M.D.   On: 08/24/2014 16:40     EKG Interpretation   Date/Time:  Tuesday August 24 2014 17:52:34 EDT Ventricular Rate:  68 PR Interval:  142 QRS Duration: 64 QT Interval:  388 QTC Calculation: 413 R Axis:   78 Text Interpretation:  Sinus rhythm Probable left atrial enlargement ST  elev, probable normal early repol pattern Baseline wander in lead(s) I II  aVR aVL Confirmed by Rubin Payor  MD, Harrold Donath [phone]) on 08/24/2014 9:34:36 PM      Date: 08/24/2014  Rate: 68  Rhythm: normal sinus rhythm  QRS Axis: normal  Intervals: normal  ST/T Wave abnormalities: normal  Conduction Disutrbances:none  Narrative Interpretation:   Old EKG Reviewed: none available   MDM    Final diagnoses:  Chest pain, unspecified chest pain type    Patient with left-sided chest pain. Sharp and reproducible. Patient is however smoker. Negative d-dimer. X-ray reassuring. Patient is worried about pneumothorax, since she states she's had this before. X-ray does not show a pneumothorax. Doubt cardiac cause. Will discharge home. Patient was informed of the possibility of early zoster, although there is no rash at this time    Juliet Rude. Rubin Payor, MD 08/24/14 2135

## 2014-08-24 NOTE — Discharge Instructions (Signed)

## 2014-08-26 ENCOUNTER — Emergency Department (HOSPITAL_COMMUNITY): Payer: Self-pay

## 2014-08-26 ENCOUNTER — Emergency Department (HOSPITAL_COMMUNITY)
Admission: EM | Admit: 2014-08-26 | Discharge: 2014-08-26 | Disposition: A | Discharge status: Home / Self Care | Payer: Self-pay | Attending: Emergency Medicine | Admitting: Emergency Medicine

## 2014-08-26 ENCOUNTER — Encounter (HOSPITAL_COMMUNITY): Payer: Self-pay | Admitting: Emergency Medicine

## 2014-08-26 DIAGNOSIS — I10 Essential (primary) hypertension: Secondary | ICD-10-CM | POA: Insufficient documentation

## 2014-08-26 DIAGNOSIS — J441 Chronic obstructive pulmonary disease with (acute) exacerbation: Secondary | ICD-10-CM | POA: Insufficient documentation

## 2014-08-26 DIAGNOSIS — Z79899 Other long term (current) drug therapy: Secondary | ICD-10-CM | POA: Insufficient documentation

## 2014-08-26 DIAGNOSIS — Z7982 Long term (current) use of aspirin: Secondary | ICD-10-CM | POA: Insufficient documentation

## 2014-08-26 DIAGNOSIS — J449 Chronic obstructive pulmonary disease, unspecified: Secondary | ICD-10-CM

## 2014-08-26 DIAGNOSIS — R0789 Other chest pain: Secondary | ICD-10-CM

## 2014-08-26 DIAGNOSIS — Z87891 Personal history of nicotine dependence: Secondary | ICD-10-CM | POA: Insufficient documentation

## 2014-08-26 DIAGNOSIS — Z91040 Latex allergy status: Secondary | ICD-10-CM | POA: Insufficient documentation

## 2014-08-26 DIAGNOSIS — R0602 Shortness of breath: Secondary | ICD-10-CM | POA: Insufficient documentation

## 2014-08-26 DIAGNOSIS — R0600 Dyspnea, unspecified: Secondary | ICD-10-CM

## 2014-08-26 LAB — BASIC METABOLIC PANEL
ANION GAP: 15 (ref 5–15)
BUN: 14 mg/dL (ref 6–23)
CHLORIDE: 101 meq/L (ref 96–112)
CO2: 24 mEq/L (ref 19–32)
Calcium: 9.8 mg/dL (ref 8.4–10.5)
Creatinine, Ser: 0.63 mg/dL (ref 0.50–1.10)
Glucose, Bld: 84 mg/dL (ref 70–99)
POTASSIUM: 4.3 meq/L (ref 3.7–5.3)
SODIUM: 140 meq/L (ref 137–147)

## 2014-08-26 LAB — PRO B NATRIURETIC PEPTIDE: PRO B NATRI PEPTIDE: 56.9 pg/mL (ref 0–125)

## 2014-08-26 LAB — TROPONIN I

## 2014-08-26 MED ORDER — IOHEXOL 350 MG/ML SOLN
100.0000 mL | Freq: Once | INTRAVENOUS | Status: AC | PRN
  Administered 2014-08-26: 100 mL via INTRAVENOUS

## 2014-08-26 MED ORDER — ALBUTEROL SULFATE HFA 108 (90 BASE) MCG/ACT IN AERS: 1.0000 | INHALATION_SPRAY | Freq: Four times a day (QID) | RESPIRATORY_TRACT | Status: DC | PRN

## 2014-08-26 NOTE — ED Notes (Addendum)
Pt states she has increased SOB, unexplained weight gain of 10lbs per pt. Pt was seen in ER 2 days ago for CP and SOB. The pt states that she feels pain in chest on deep inspiration.

## 2014-08-26 NOTE — ED Notes (Signed)
Ambulated patient around nursing station. Oxygen saturation was 98 percent. She states that she is feeling about the same as when she came in. Some coughing upon returning to room. States that she was feeling a little short of breath nothing different.

## 2014-08-26 NOTE — ED Provider Notes (Signed)
CSN: [id number]     Arrival date & time 08/26/14  1259 History  This chart was scribed for Joya Gaskins, MD, by Yevette Edwards, ED Scribe. This patient was seen in room APA08/[facility]08 and the patient's care was started at 1:44 PM.   First MD Initiated Contact with Patient 08/26/14 1341     Chief Complaint  Patient presents with  . Shortness of Breath   Patient is a 48 y.o. female presenting with chest pain. The history is provided by the patient. No language interpreter was used.  Chest Pain Pain location:  L chest Pain radiates to:  Upper back Pain radiates to the back: yes   Pain severity:  Severe Onset quality:  Unable to specify Duration:  5 days Timing:  Intermittent Progression:  Unchanged Chronicity:  New Context: breathing, lifting, movement and raising an arm   Relieved by:  Certain positions (Ambulating) Worsened by:  Certain positions, deep breathing and movement Associated symptoms: cough and shortness of breath   Associated symptoms: no abdominal pain   Risk factors: hypertension, obesity and smoking     Gabriella Vance is a 48 y.o. female, with a h/o HTN, who presents to the Emergency Department complaining of intermittent left-sided chest pain which began four days ago when she was at home. She reports the chest pain is also to her lateral ribs and shoulder blades. The pain is increased when she is recumbent, with deep inspiration, or with transitions from sitting to laying down. She also states the pain is increased with moving her arms as it creates a "pulling" sensation. She states that walking improves the pain. She also states she has slept in her recliner for the past four nights to alleviate the chest pain and associated SOB which she characterizes as "Dumbo sitting on my chest."  Gabriella Vance also reports a cough, congestion, and unexpected weight gain of ten pounds within two days. She states the cough and congestion improve throughout the day. She denies recent  lower extremity swelling increased from baseline, abdominal pain, or a rash. She states she has had multiple right lung issues. She has not had any lung problems in five years. She denies a h/o PE, emboli, or MI. When the pt was treated in the ED two days ago for similar symptoms she was assessed for a PE and emboli, both of which were normal.  She is a former smoker.  Past Medical History  Diagnosis Date  . Hypertension    Past Surgical History  Procedure Laterality Date  . Lung surgery    . Cholecystectomy    . Hiatal hernia repair    . Tendon repair     History reviewed. No pertinent family history. History  Substance Use Topics  . Smoking status: Former Games developer  . Smokeless tobacco: Not on file  . Alcohol Use: No   No OB history provided.  Review of Systems  HENT: Positive for congestion.   Respiratory: Positive for cough and shortness of breath.   Cardiovascular: Positive for chest pain. Negative for leg swelling.  Gastrointestinal: Negative for abdominal pain.  Skin: Negative for rash.  All other systems reviewed and are negative.   Allergies  Latex  Home Medications   Prior to Admission medications   Medication Sig Start Date End Date Taking? Authorizing Provider  acetaminophen (TYLENOL) 500 MG tablet Take 1,000 mg by mouth every 6 (six) hours as needed. Pain   Yes Historical Provider, MD  aspirin EC 81 MG tablet  Take 81 mg by mouth daily.   Yes Historical Provider, MD  atenolol (TENORMIN) 25 MG tablet Take 25 mg by mouth daily.   Yes Historical Provider, MD  FIBER SELECT GUMMIES PO Take 2 tablets by mouth daily.   Yes Historical Provider, MD  Multiple Vitamin (MULTIVITAMIN WITH MINERALS) TABS Take 1 tablet by mouth daily.   Yes Historical Provider, MD   Triage Vitals: BP 148/74  Pulse 84  Temp(Src) 97.8 F (36.6 C) (Oral)  Resp 20  Wt 196 lb 7 oz (89.103 kg)  SpO2 100%  Physical Exam  Constitutional: well developed, well nourished, no distress Head:  normocephalic/atraumatic Eyes: EOMI/PERRL ENMT: mucous membranes moist Neck: supple, no meningeal signs CV: no murmur/rubs/gallops noted; mild tenderness to left chest no crepitus Lungs: clear to auscultation bilaterally but she appears mildly tachypneic Abd: soft, nontender Extremities: full ROM noted, pulses normal/equal; no lower extremity edema or tenderness Neuro: awake/alert, no distress, appropriate for age, [id number]. Skin:   Color normal.  Warm Psych: appropriate for age  ED Course  Procedures  DIAGNOSTIC STUDIES: Oxygen Saturation is 100% on room air, normal by my interpretation.    COORDINATION OF CARE:  1:56 PM- Discussed treatment plan with patient, and the patient agreed to the plan.  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  I advised f/u with pulmonology Stable lung nodule noted on today's CT imaging She no longer takes albuterol this was prescribed at discharge Labs Review Labs Reviewed  BASIC METABOLIC PANEL  TROPONIN I  PRO B NATRIURETIC PEPTIDE    Imaging Review Ct Angio Chest Pe W/cm &/or Wo Cm  08/26/2014   CLINICAL DATA:  chest pain, shortness of Breath, hypertension  EXAM: CT ANGIOGRAPHY CHEST WITH CONTRAST  TECHNIQUE: Multidetector CT imaging of the chest was performed using the standard protocol during bolus administration of intravenous contrast. Multiplanar CT image reconstructions and MIPs were obtained to evaluate the vascular anatomy.  CONTRAST:  OMNIPAQUE IOHEXOL 350 MG/ML SOLN   COMPARISON:  07/13/2009  FINDINGS: Satisfactory opacification of pulmonary arteries noted, and there is no evidence of pulmonary emboli. Adequate contrast opacification of the thoracic aorta with no evidence of dissection, aneurysm, or stenosis. There is bovine variant brachiocephalic arch anatomy without proximal stenosis. No pleural or pericardial effusion. No hilar or mediastinal adenopathy. Large biapical blebs in diffuse emphysematous will changes most marked in the upper lobes. There is some subsegmental atelectasis in the posterior right lower lobe and posterior medial left lower lobe. 5 mm nodule adjacent to the minor fissure image 49/6 , stable. No new nodule. Thoracic spine and sternum intact. Visualized portions of upper abdomen unremarkable.  Review of the MIP images confirms the above findings.  IMPRESSION: 1. Negative for acute PE or thoracic aortic dissection. 2. Stable small right lung nodule and changes of bullous emphysema.   Electronically Signed   By: Oley Balm M.D.   On: 08/26/2014 15:21     EKG Interpretation   Date/Time:  Thursday August 26 2014 14:08:06 EDT Ventricular Rate:  72 PR Interval:  125 QRS Duration: 89 QT Interval:  412 QTC  Calculation: 451 R Axis:   72 Text Interpretation:  Sinus rhythm T wave inversion in aVL No significant  change since last tracing Confirmed by Bebe Shaggy  MD, Dorinda Hill ([id number]) on  08/26/2014 2:26:45 PM      MDM   Final diagnoses:  Dyspnea  Chest wall pain  Chronic obstructive pulmonary disease, unspecified COPD, unspecified chronic bronchitis type    Nursing notes including past medical history and social history reviewed and considered in documentation Labs/vital reviewed and considered Previous records reviewed and considered   I personally performed the services described in this documentation, which was scribed in my presence. The recorded information has been reviewed and is accurate.       Joya Gaskins,  MD 08/26/14 [phone]

## 2015-08-27 IMAGING — CT CT ANGIO CHEST
2 of 6 series · 6 of 36 positions shown · IV contrast (Omnipaque 300)
Comparison: 07/13/2009

CLINICAL DATA: chest pain, shortness of Breath, hypertension

EXAM:
CT ANGIOGRAPHY CHEST WITH CONTRAST
TECHNIQUE: Multidetector CT imaging of the chest was performed using the
standard protocol during bolus administration of intravenous
contrast. Multiplanar CT image reconstructions and MIPs were
obtained to evaluate the vascular anatomy.
CONTRAST:  100mL OMNIPAQUE IOHEXOL 350 MG/ML SOLN

[Series 5: pe 3.0 b40f · axial · 0.64mm/px · z∈[-264,-78]mm · 5 of 94 slices shown]
[im 16/94  lung]
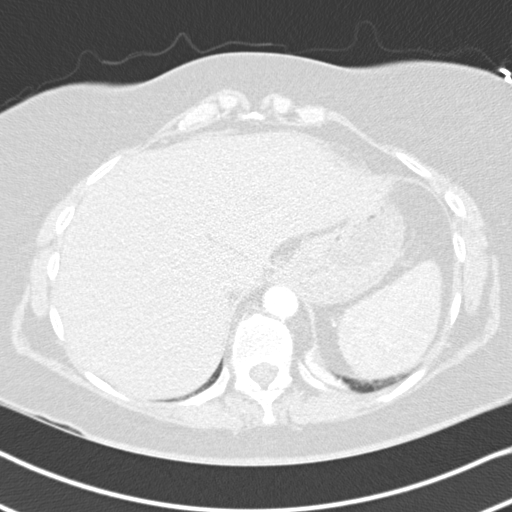
[im 32/94  mediastinal]
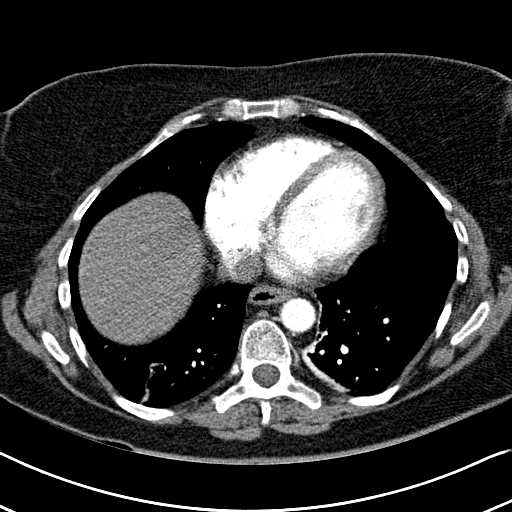
[im 47/94  lung]
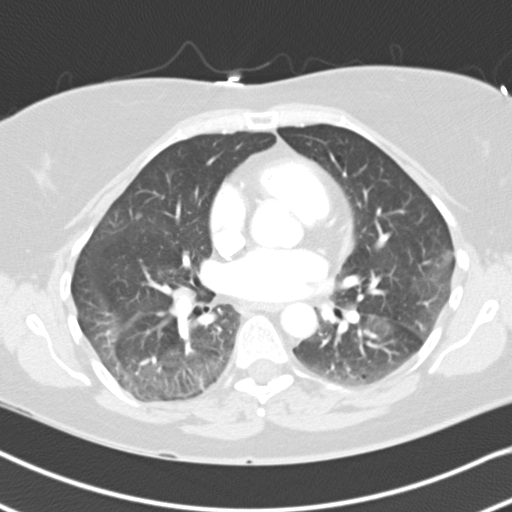
[im 63/94  mediastinal]
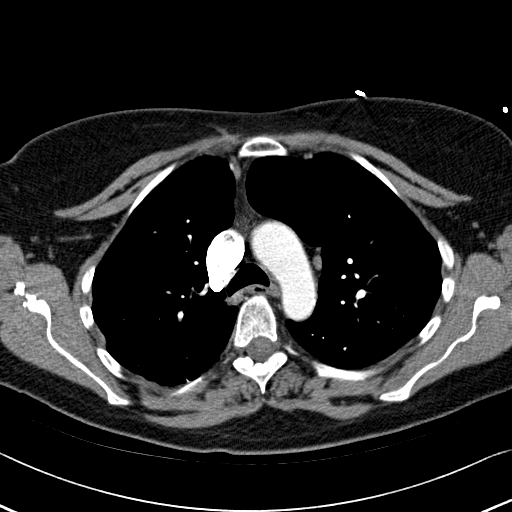
[im 78/94  lung]
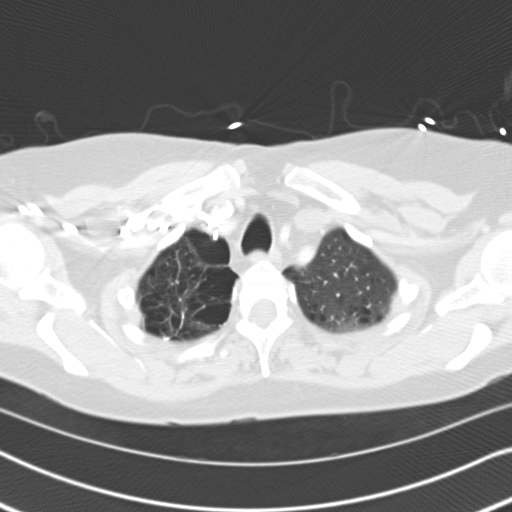

[Series 7: mpr coronal pe 3mm · coronal · 0.57mm/px · 1 of 81 slices shown]
[im 41/81  mediastinal]
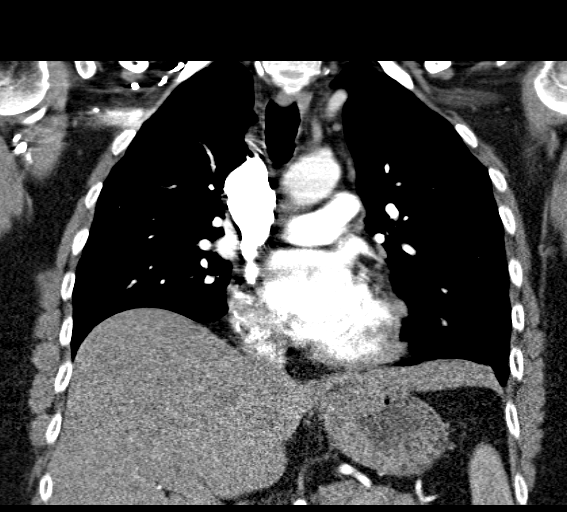

[6 of 36 positions shown; findings below may reference images not displayed]

FINDINGS: Satisfactory opacification of pulmonary arteries noted, and there is
no evidence of pulmonary emboli. Adequate contrast opacification of
the thoracic aorta with no evidence of dissection, aneurysm, or
stenosis. There is bovine variant brachiocephalic arch anatomy
without proximal stenosis. No pleural or pericardial effusion. No
hilar or mediastinal adenopathy. Large biapical blebs in diffuse
emphysematous will changes most marked in the upper lobes. There is
some subsegmental atelectasis in the posterior right lower lobe and
posterior medial left lower lobe. 5 mm nodule adjacent to the minor
fissure image 49/6 , stable. No new nodule. Thoracic spine and
sternum intact. Visualized portions of upper abdomen unremarkable.

Review of the MIP images confirms the above findings.
IMPRESSION: 1. Negative for acute PE or thoracic aortic dissection.
2. Stable small right lung nodule and changes of bullous emphysema.

## 2018-08-07 ENCOUNTER — Encounter (HOSPITAL_COMMUNITY): Payer: Self-pay

## 2018-08-07 ENCOUNTER — Emergency Department (HOSPITAL_COMMUNITY)
Admission: EM | Admit: 2018-08-07 | Discharge: 2018-08-07 | Disposition: A | Discharge status: Home / Self Care | Payer: Self-pay | Attending: Emergency Medicine | Admitting: Emergency Medicine

## 2018-08-07 ENCOUNTER — Emergency Department (HOSPITAL_COMMUNITY): Payer: Self-pay

## 2018-08-07 DIAGNOSIS — Z5321 Procedure and treatment not carried out due to patient leaving prior to being seen by health care provider: Secondary | ICD-10-CM | POA: Insufficient documentation

## 2018-08-07 DIAGNOSIS — R0602 Shortness of breath: Secondary | ICD-10-CM | POA: Insufficient documentation

## 2018-08-07 HISTORY — DX: Chronic obstructive pulmonary disease, unspecified: J44.9

## 2018-08-07 HISTORY — DX: Unspecified osteoarthritis, unspecified site: M19.90

## 2018-08-07 MED ORDER — ALBUTEROL SULFATE (2.5 MG/3ML) 0.083% IN NEBU: 5.0000 mg | INHALATION_SOLUTION | Freq: Once | RESPIRATORY_TRACT | Status: DC

## 2018-08-07 NOTE — ED Notes (Signed)
Called by xray not in room

## 2018-08-07 NOTE — ED Notes (Signed)
pT NOT IN WAITIGN ROOM

## 2018-08-07 NOTE — ED Notes (Signed)
Pt ok in waiting area, updated up rooms, no change in status.
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
## 2018-08-11 NOTE — ED Notes (Signed)
Follow up call made  No answer  08/11/18  1120  s Lebert Lovern rn

## 2018-08-25 ENCOUNTER — Other Ambulatory Visit: Payer: Self-pay

## 2018-08-25 ENCOUNTER — Emergency Department (HOSPITAL_COMMUNITY)
Admission: EM | Admit: 2018-08-25 | Discharge: 2018-08-25 | Disposition: A | Discharge status: Home / Self Care | Payer: Self-pay | Attending: Emergency Medicine | Admitting: Emergency Medicine

## 2018-08-25 ENCOUNTER — Emergency Department (HOSPITAL_COMMUNITY): Payer: Self-pay

## 2018-08-25 ENCOUNTER — Encounter (HOSPITAL_COMMUNITY): Payer: Self-pay

## 2018-08-25 DIAGNOSIS — J40 Bronchitis, not specified as acute or chronic: Secondary | ICD-10-CM | POA: Insufficient documentation

## 2018-08-25 DIAGNOSIS — Z87891 Personal history of nicotine dependence: Secondary | ICD-10-CM | POA: Insufficient documentation

## 2018-08-25 DIAGNOSIS — I10 Essential (primary) hypertension: Secondary | ICD-10-CM | POA: Insufficient documentation

## 2018-08-25 DIAGNOSIS — J449 Chronic obstructive pulmonary disease, unspecified: Secondary | ICD-10-CM | POA: Insufficient documentation

## 2018-08-25 DIAGNOSIS — Z79899 Other long term (current) drug therapy: Secondary | ICD-10-CM | POA: Insufficient documentation

## 2018-08-25 MED ORDER — AZITHROMYCIN 250 MG PO TABS: 250.0000 mg | ORAL_TABLET | Freq: Every day | ORAL | 0 refills | Status: DC

## 2018-08-25 MED ORDER — DEXAMETHASONE 4 MG PO TABS
8.0000 mg | ORAL_TABLET | Freq: Once | ORAL | Status: AC
  Administered 2018-08-25: 8 mg via ORAL
  Filled 2018-08-25: qty 2

## 2018-08-25 MED ORDER — DEXAMETHASONE 4 MG PO TABS: 4.0000 mg | ORAL_TABLET | Freq: Two times a day (BID) | ORAL | 0 refills | Status: DC
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
## 2018-09-15 MED ORDER — SODIUM CHLORIDE 0.9% FLUSH
INTRAVENOUS | Status: AC
  Filled 2018-09-15: qty 200

## 2018-09-24 ENCOUNTER — Encounter (HOSPITAL_COMMUNITY): Payer: Self-pay | Admitting: Neurology

## 2018-09-24 ENCOUNTER — Emergency Department (HOSPITAL_COMMUNITY): Payer: Self-pay

## 2018-09-24 ENCOUNTER — Emergency Department (HOSPITAL_COMMUNITY)
Admission: EM | Admit: 2018-09-24 | Discharge: 2018-09-24 | Disposition: A | Discharge status: Home / Self Care | Payer: Self-pay | Attending: Emergency Medicine | Admitting: Emergency Medicine

## 2018-09-24 DIAGNOSIS — Z87891 Personal history of nicotine dependence: Secondary | ICD-10-CM | POA: Insufficient documentation

## 2018-09-24 DIAGNOSIS — Z79899 Other long term (current) drug therapy: Secondary | ICD-10-CM | POA: Insufficient documentation

## 2018-09-24 DIAGNOSIS — J441 Chronic obstructive pulmonary disease with (acute) exacerbation: Secondary | ICD-10-CM | POA: Insufficient documentation

## 2018-09-24 DIAGNOSIS — I10 Essential (primary) hypertension: Secondary | ICD-10-CM | POA: Insufficient documentation

## 2018-09-24 DIAGNOSIS — Z7982 Long term (current) use of aspirin: Secondary | ICD-10-CM | POA: Insufficient documentation

## 2018-09-24 MED ORDER — ALBUTEROL SULFATE HFA 108 (90 BASE) MCG/ACT IN AERS
2.0000 | INHALATION_SPRAY | RESPIRATORY_TRACT | Status: DC | PRN
  Administered 2018-09-24: 2 via RESPIRATORY_TRACT
  Filled 2018-09-24: qty 6.7

## 2018-09-24 MED ORDER — GUAIFENESIN 100 MG/5ML PO SYRP: 200.0000 mg | ORAL_SOLUTION | Freq: Three times a day (TID) | ORAL | 0 refills | Status: DC | PRN

## 2018-09-24 MED ORDER — DEXAMETHASONE 4 MG PO TABS: 4.0000 mg | ORAL_TABLET | Freq: Two times a day (BID) | ORAL | 0 refills | Status: DC

## 2018-09-24 MED ORDER — ALBUTEROL SULFATE (2.5 MG/3ML) 0.083% IN NEBU
5.0000 mg | INHALATION_SOLUTION | Freq: Once | RESPIRATORY_TRACT | Status: AC
  Administered 2018-09-24: 5 mg via RESPIRATORY_TRACT
  Filled 2018-09-24: qty 6

## 2018-09-24 MED ORDER — PREDNISONE 20 MG PO TABS
60.0000 mg | ORAL_TABLET | Freq: Once | ORAL | Status: AC
  Administered 2018-09-24: 60 mg via ORAL
  Filled 2018-09-24: qty 3

## 2018-09-24 MED ORDER — IPRATROPIUM BROMIDE 0.02 % IN SOLN
0.5000 mg | Freq: Once | RESPIRATORY_TRACT | Status: AC
  Administered 2018-09-24: 0.5 mg via RESPIRATORY_TRACT
  Filled 2018-09-24: qty 2.5
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
## 2019-06-16 ENCOUNTER — Emergency Department (HOSPITAL_COMMUNITY)
Admission: EM | Admit: 2019-06-16 | Discharge: 2019-06-17 | Disposition: A | Discharge status: Home / Self Care | Payer: Self-pay | Attending: Emergency Medicine | Admitting: Emergency Medicine

## 2019-06-16 ENCOUNTER — Other Ambulatory Visit: Payer: Self-pay

## 2019-06-16 DIAGNOSIS — I10 Essential (primary) hypertension: Secondary | ICD-10-CM | POA: Insufficient documentation

## 2019-06-16 DIAGNOSIS — Z87891 Personal history of nicotine dependence: Secondary | ICD-10-CM | POA: Insufficient documentation

## 2019-06-16 DIAGNOSIS — J449 Chronic obstructive pulmonary disease, unspecified: Secondary | ICD-10-CM | POA: Insufficient documentation

## 2019-06-16 DIAGNOSIS — K5792 Diverticulitis of intestine, part unspecified, without perforation or abscess without bleeding: Secondary | ICD-10-CM | POA: Insufficient documentation

## 2019-06-16 DIAGNOSIS — Z79899 Other long term (current) drug therapy: Secondary | ICD-10-CM | POA: Insufficient documentation

## 2019-06-16 LAB — URINALYSIS, ROUTINE W REFLEX MICROSCOPIC
Bacteria, UA: NONE SEEN
Bilirubin Urine: NEGATIVE
Glucose, UA: NEGATIVE mg/dL
Ketones, ur: NEGATIVE mg/dL
Leukocytes,Ua: NEGATIVE
Nitrite: NEGATIVE
Protein, ur: NEGATIVE mg/dL
Specific Gravity, Urine: 1.001 — ABNORMAL LOW (ref 1.005–1.030)
pH: 6 (ref 5.0–8.0)

## 2019-06-16 LAB — COMPREHENSIVE METABOLIC PANEL
ALT: 20 U/L (ref 0–44)
AST: 23 U/L (ref 15–41)
Albumin: 4 g/dL (ref 3.5–5.0)
Alkaline Phosphatase: 73 U/L (ref 38–126)
Anion gap: 11 (ref 5–15)
BUN: 11 mg/dL (ref 6–20)
CO2: 22 mmol/L (ref 22–32)
Calcium: 9.7 mg/dL (ref 8.9–10.3)
Chloride: 104 mmol/L (ref 98–111)
Creatinine, Ser: 0.66 mg/dL (ref 0.44–1.00)
GFR calc Af Amer: >60 mL/min (ref >60)
GFR calc non Af Amer: >60 mL/min (ref >60)
Glucose, Bld: 99 mg/dL (ref 70–99)
Potassium: 3.9 mmol/L (ref 3.5–5.1)
Sodium: 137 mmol/L (ref 135–145)
Total Bilirubin: 0.8 mg/dL (ref 0.3–1.2)
Total Protein: 7.4 g/dL (ref 6.5–8.1)

## 2019-06-16 LAB — CBC
HCT: 37.3 % (ref 36.0–46.0)
Hemoglobin: 12.5 g/dL (ref 12.0–15.0)
MCH: 30.3 pg (ref 26.0–34.0)
MCHC: 33.5 g/dL (ref 30.0–36.0)
MCV: 90.5 fL (ref 80.0–100.0)
Platelets: 249 10*3/uL (ref 150–400)
RBC: 4.12 MIL/uL (ref 3.87–5.11)
RDW: 12.4 % (ref 11.5–15.5)
WBC: 12.3 10*3/uL — ABNORMAL HIGH (ref 4.0–10.5)
nRBC: 0 % (ref 0.0–0.2)

## 2019-06-16 LAB — LIPASE, BLOOD: Lipase: 34 U/L (ref 11–51)

## 2019-06-16 MED ORDER — SODIUM CHLORIDE 0.9% FLUSH
3.0000 mL | Freq: Once | INTRAVENOUS | Status: AC
  Administered 2019-06-17: 3 mL via INTRAVENOUS

## 2019-06-16 NOTE — ED Triage Notes (Signed)
Onset last night abd pain RLQ.  Worse with movement, better when laying on right side.  No other s/s.

## 2019-06-17 ENCOUNTER — Emergency Department (HOSPITAL_COMMUNITY): Payer: Self-pay

## 2019-06-17 MED ORDER — HYDROCODONE-ACETAMINOPHEN 5-325 MG PO TABS: 1.0000 | ORAL_TABLET | ORAL | 0 refills | Status: DC | PRN

## 2019-06-17 MED ORDER — METRONIDAZOLE 500 MG PO TABS: 500.0000 mg | ORAL_TABLET | Freq: Three times a day (TID) | ORAL | 0 refills | Status: DC

## 2019-06-17 MED ORDER — KETOROLAC TROMETHAMINE 30 MG/ML IJ SOLN
15.0000 mg | Freq: Once | INTRAMUSCULAR | Status: AC
  Administered 2019-06-17: 15 mg via INTRAVENOUS
  Filled 2019-06-17: qty 1

## 2019-06-17 MED ORDER — KETOROLAC TROMETHAMINE 15 MG/ML IJ SOLN
INTRAMUSCULAR | Status: AC
  Filled 2019-06-17: qty 1

## 2019-06-17 MED ORDER — SODIUM CHLORIDE 0.9 % IV BOLUS
1000.0000 mL | Freq: Once | INTRAVENOUS | Status: AC
  Administered 2019-06-17: 1000 mL via INTRAVENOUS

## 2019-06-17 MED ORDER — METRONIDAZOLE IN NACL 5-0.79 MG/ML-% IV SOLN
500.0000 mg | Freq: Once | INTRAVENOUS | Status: AC
  Administered 2019-06-17: 500 mg via INTRAVENOUS
  Filled 2019-06-17: qty 100

## 2019-06-17 MED ORDER — ONDANSETRON HCL 4 MG/2ML IJ SOLN
4.0000 mg | Freq: Once | INTRAMUSCULAR | Status: AC
  Administered 2019-06-17: 4 mg via INTRAVENOUS
  Filled 2019-06-17: qty 2

## 2019-06-17 MED ORDER — CIPROFLOXACIN IN D5W 400 MG/200ML IV SOLN
400.0000 mg | Freq: Once | INTRAVENOUS | Status: AC
  Administered 2019-06-17: 400 mg via INTRAVENOUS
  Filled 2019-06-17: qty 200

## 2019-06-17 MED ORDER — ONDANSETRON 4 MG PO TBDP: 4.0000 mg | ORAL_TABLET | Freq: Three times a day (TID) | ORAL | 0 refills | Status: DC | PRN

## 2019-06-17 MED ORDER — MORPHINE SULFATE (PF) 4 MG/ML IV SOLN
4.0000 mg | Freq: Once | INTRAVENOUS | Status: AC
  Administered 2019-06-17: 4 mg via INTRAVENOUS
  Filled 2019-06-17: qty 1

## 2019-06-17 MED ORDER — CIPROFLOXACIN HCL 500 MG PO TABS: 500.0000 mg | ORAL_TABLET | Freq: Two times a day (BID) | ORAL | 0 refills | Status: DC

## 2019-06-17 MED ORDER — IOHEXOL 300 MG/ML  SOLN
100.0000 mL | Freq: Once | INTRAMUSCULAR | Status: AC | PRN
  Administered 2019-06-17: 100 mL via INTRAVENOUS

## 2019-06-17 MED ORDER — SODIUM CHLORIDE 0.9 % IV BOLUS
500.0000 mL | Freq: Once | INTRAVENOUS | Status: AC
  Administered 2019-06-17: 05:00:00 500 mL via INTRAVENOUS

## 2019-06-17 NOTE — ED Notes (Signed)
To continue to monitor BP after 500 mL bolus. To walk with patient after completion of bolus.

## 2019-06-17 NOTE — Discharge Instructions (Signed)
Your work-up today revealed diverticulitis.  Start a clear liquid diet and continue until pain resolves.  You have been started on ciprofloxacin and Flagyl.  Take these antibiotics as prescribed until finished.  You may use Norco as needed for management of severe abdominal pain.  Take Zofran for nausea management.  We recommend close follow-up with your primary care doctor in 48 hours for symptom recheck.  Should you develop worsening abdominal pain, fever over 100.53F, uncontrolled vomiting, bloody bowel movements, lightheadedness, episodes of passing out, or other new or concerning symptoms, return to the emergency department for repeat evaluation.

## 2019-06-17 NOTE — ED Notes (Signed)
Discharge instructions discussed with pt. Pt verbalized understanding. Pt. To follow up with PCP within 2 days

## 2019-06-17 NOTE — ED Notes (Signed)
Pt. Ambulated in hall. Pt. Denies dizziness /other s/s. PA notified

## 2019-06-17 NOTE — ED Notes (Signed)
Patient transported to CT 

## 2019-06-17 NOTE — ED Provider Notes (Signed)
[facility]   CSN: [id number] Arrival date & time: 06/16/19  2154    History   Chief Complaint Chief Complaint  Patient presents with   Abdominal Pain    HPI Gabriella Vance is a 53 y.o. female.     53 year old female with a history of COPD and hypertension presents to the emergency department for evaluation of right lower quadrant pain.  She states that pain began at 2000 yesterday after dinner.  She initially felt that something did not agree with her stomach.  Pain was initially periumbilical.  It has migrated to her right lower quadrant today.  It is worse with movement and slightly better when laying on her right side.  She has not had any improvement to her pain with ibuprofen.  No nausea, vomiting, dysuria, hematuria, bowel changes.  She has an abdominal surgical history significant for cholecystectomy and hiatal hernia repair.  The history is provided by the patient. No language interpreter was used.  Abdominal Pain   Past Medical History:  Diagnosis Date   Arthritis    COPD (chronic obstructive pulmonary disease) (HCC)    Hypertension     There are no active problems to display for this patient.   Past Surgical History:  Procedure Laterality Date   CHOLECYSTECTOMY     HIATAL HERNIA REPAIR     LUNG SURGERY     TENDON REPAIR       OB History   No obstetric history on file.      Home Medications    Prior to Admission medications   Medication Sig Start Date End Date Taking? Authorizing Provider  atenolol (TENORMIN) 25 MG tablet Take 25 mg by mouth daily.   Yes [provider]  ibuprofen (ADVIL,MOTRIN) 200 MG tablet Take 400 mg by mouth every 6 (six) hours as needed for mild pain.    Yes [provider]  Multiple Vitamin (MULTIVITAMIN WITH MINERALS) TABS Take 1 tablet by mouth daily.   Yes [provider]  albuterol (PROVENTIL HFA;VENTOLIN HFA) 108 (90 BASE) MCG/ACT  inhaler Inhale 1-2 puffs into the lungs every 6 (six) hours as needed for wheezing or shortness of breath. Patient not taking: Reported on 06/17/2019 08/26/14   Zadie RhineWickline, Donald, MD  ciprofloxacin (CIPRO) 500 MG tablet Take 1 tablet (500 mg total) by mouth every 12 (twelve) hours. 06/17/19   Antony MaduraHumes, Jerret Mcbane, PA-C  dexamethasone (DECADRON) 4 MG tablet Take 1 tablet (4 mg total) by mouth 2 (two) times daily. Patient not taking: Reported on 06/17/2019 09/24/18   Fayrene Helperran, Bowie, PA-C  guaifenesin (ROBITUSSIN) 100 MG/5ML syrup Take 10 mLs (200 mg total) by mouth 3 (three) times daily as needed for cough or congestion. Patient not taking: Reported on 06/17/2019 09/24/18   Fayrene Helperran, Bowie, PA-C  HYDROcodone-acetaminophen (NORCO/VICODIN) 5-325 MG tablet Take 1 tablet by mouth every 4 (four) hours as needed for moderate pain or severe pain. 06/17/19   Antony MaduraHumes, Etana Beets, PA-C  metroNIDAZOLE (FLAGYL) 500 MG tablet Take 1 tablet (500 mg total) by mouth 3 (three) times daily. 06/17/19   Antony MaduraHumes, Nea Gittens, PA-C  ondansetron (ZOFRAN ODT) 4 MG disintegrating tablet Take 1 tablet (4 mg total) by mouth every 8 (eight) hours as needed for nausea or vomiting. 06/17/19   Antony MaduraHumes, Marybelle Giraldo, PA-C    Family History No family history on file.  Social History Social History   Tobacco Use   Smoking status: Former Smoker  Substance Use Topics   Alcohol use: No  Drug use: No     Allergies   Latex   Review of Systems Review of Systems  Gastrointestinal: Positive for abdominal pain.  Ten systems reviewed and are negative for acute change, except as noted in the HPI.    Physical Exam Updated Vital Signs BP (!) 119/59    Pulse 65    Temp 98.3 F (36.8 C) (Oral)    Resp 16    SpO2 96%   Physical Exam Vitals signs and nursing note reviewed.  Constitutional:      General: She is not in acute distress.    Appearance: She is well-developed. She is not diaphoretic.     Comments: Nontoxic appearing and in NAD  HENT:     Head:  Normocephalic and atraumatic.  Eyes:     General: No scleral icterus.    Conjunctiva/sclera: Conjunctivae normal.  Neck:     Musculoskeletal: Normal range of motion.  Pulmonary:     Effort: Pulmonary effort is normal. No respiratory distress.     Comments: Respirations even and unlabored Abdominal:     Palpations: Abdomen is soft. There is no mass.     Tenderness: There is abdominal tenderness (RLQ). There is guarding (McBurney's point).  Musculoskeletal: Normal range of motion.  Skin:    General: Skin is warm and dry.     Coloration: Skin is not pale.     Findings: No erythema or rash.  Neurological:     Mental Status: She is alert and oriented to person, place, and time.  Psychiatric:        Behavior: Behavior normal.      ED Treatments / Results  Labs (all labs ordered are listed, but only abnormal results are displayed) Labs Reviewed  CBC - Abnormal; Notable for the following components:      Result Value   WBC 12.3 (*)    All other components within normal limits  URINALYSIS, ROUTINE W REFLEX MICROSCOPIC - Abnormal; Notable for the following components:   Color, Urine COLORLESS (*)    Specific Gravity, Urine 1.001 (*)    Hgb urine dipstick SMALL (*)    All other components within normal limits  LIPASE, BLOOD  COMPREHENSIVE METABOLIC PANEL    EKG None  Radiology Ct Abdomen Pelvis W Contrast  Result Date: 06/17/2019 CLINICAL DATA:  Right lower quadrant abdominal pain. EXAM: CT ABDOMEN AND PELVIS WITH CONTRAST TECHNIQUE: Multidetector CT imaging of the abdomen and pelvis was performed using the standard protocol following bolus administration of intravenous contrast. CONTRAST:  100mL OMNIPAQUE IOHEXOL 300 MG/ML  SOLN COMPARISON:  None. FINDINGS: Lower chest: No prior abdominal imaging. CT 08/26/2014 mild lingular and right lower lobe scarring. Hepatobiliary: No focal hepatic abnormality. Postcholecystectomy. Dilated common bile duct, 19 mm at the porta hepatis with  tapering to the duodenum, considered postoperative in the setting of normal LFTs. Mild central intrahepatic biliary ductal dilatation. Pancreas: No peripancreatic inflammation. Proximal ductal dilatation at 6 mm. Slight heterogeneity of the pancreatic head without evidence of focal mass. Spleen: Normal in size without focal abnormality. Adrenals/Urinary Tract: Normal adrenal glands. No hydronephrosis or perinephric edema. Homogeneous renal enhancement with symmetric excretion on delayed phase imaging. Urinary bladder is physiologically distended without wall thickening. Stomach/Bowel: Colonic wall thickening with inflamed diverticulum of the cecum/proximal ascending colon with surrounding fat stranding. Small amount of free fluid. No perforation or abscess. Appendix is well-visualized and normal coursing into the pelvis, for example image 76 series 3. Innumerable noninflamed colonic diverticula throughout the entire colon.  No small bowel dilatation, obstruction or inflammation. Small hiatal hernia. Vascular/Lymphatic: Mild aorta bi-iliac atherosclerosis. No aneurysm. Few small pericecal and ileocolic nodes are nonspecific, measuring up to 5 mm. Reproductive: 2.6 cm right ovarian cyst appears simple fluid density. Uterus and left ovary are quiescent. Other: Small amount of free fluid in the right pericolic gutter and lower quadrant. No abscess or free air. Minimal fat in the inguinal canals. Tiny fat containing umbilical hernia. Musculoskeletal: There are no acute or suspicious osseous abnormalities. IMPRESSION: 1. Acute cecal diverticulitis. No perforation or abscess. 2. Diffuse colonic diverticulosis throughout the entire colon. 3. Common bile duct dilatation post cholecystectomy, presumed postsurgical in the setting of LFTs. However there is also dilatation of the proximal pancreatic duct. No prior exams to evaluate for chronicity. Consider further evaluation with MRCP after resolution of acute event. 4.  Incidental right ovarian cyst measures 2.6 cm. No dedicated imaging follow-up is needed. This recommendation follows ACR consensus guidelines: White Paper of the ACR Incidental Findings Committee II on Adnexal Findings. J Am Coll Radiol 2013:10:675-681. 5.  Aortic Atherosclerosis (ICD10-I70.0). Electronically Signed   By: Keith Rake M.D.   On: 06/17/2019 01:21    Procedures Procedures (including critical care time)  Medications Ordered in ED Medications  sodium chloride flush (NS) 0.9 % injection 3 mL (3 mLs Intravenous Given 06/17/19 0029)  morphine 4 MG/ML injection 4 mg (4 mg Intravenous Given 06/17/19 0030)  ondansetron (ZOFRAN) injection 4 mg (4 mg Intravenous Given 06/17/19 0030)  sodium chloride 0.9 % bolus 1,000 mL (0 mLs Intravenous Stopped 06/17/19 0230)  iohexol (OMNIPAQUE) 300 MG/ML solution 100 mL (100 mLs Intravenous Contrast Given 06/17/19 0102)  ciprofloxacin (CIPRO) IVPB 400 mg (0 mg Intravenous Stopped 06/17/19 0520)  metroNIDAZOLE (FLAGYL) IVPB 500 mg (0 mg Intravenous Stopped 06/17/19 0324)  ketorolac (TORADOL) 30 MG/ML injection 15 mg (15 mg Intravenous Given 06/17/19 0227)  sodium chloride 0.9 % bolus 500 mL (0 mLs Intravenous Stopped 06/17/19 0559)    5:25 AM Went to reassess the patient where her blood pressure was being rechecked.  Her systolic blood pressure was 81.  SpO2 95% on room air.  She states that her blood pressure is usually "low".  Notes that she was sleeping prior to being woken up to check her vitals.  She has been up and ambulatory to the bathroom on multiple occasions since arrival.  Denies any lightheadedness with position change or when getting up to ambulate.  States that she feels fine currently.  Her blood pressure was rechecked with systolic improving to 102.  She has not had any compensatory tachycardia.  Will infuse additional 500 cc IV fluids.  If blood pressure remains stable following infusion of fluids, I believe she can continue with discharge as  planned.  6:05 AM BP 119/59. SpO2 95% on room air. RR 17. Heart rate in the 60's. Patient states that she feels well. She expresses comfort with discharge.   Initial Impression / Assessment and Plan / ED Course  I have reviewed the triage vital signs and the nursing notes.  Pertinent labs & imaging results that were available during my care of the patient were reviewed by me and considered in my medical decision making (see chart for details).        53 year old female presents to the emergency department for right lower quadrant abdominal pain x2 days.  Noted to have focal tenderness in the right lower quadrant on exam.  No peritoneal signs.  Patient afebrile in the ED  with stable vital signs.  She underwent CT of her abdomen and pelvis to further differentiate because of pain and leukocytosis of 12.3.  While appendicitis was suspected, her appendix was normal and she was found to, instead, have diverticulitis.  This was managed in the ED with ciprofloxacin and Flagyl.  Pain is improved with morphine and Toradol.  Patient discharged with instruction to follow-up with her primary care doctor in 48 hours.  She will continue with outpatient antibiotics.  Return precautions discussed and provided. Patient discharged in stable condition with no unaddressed concerns.   Final Clinical Impressions(s) / ED Diagnoses   Final diagnoses:  Diverticulitis    ED Discharge Orders         Ordered    ciprofloxacin (CIPRO) 500 MG tablet  Every 12 hours     06/17/19 0518    metroNIDAZOLE (FLAGYL) 500 MG tablet  3 times daily     06/17/19 0518    HYDROcodone-acetaminophen (NORCO/VICODIN) 5-325 MG tablet  Every 4 hours PRN     06/17/19 0518    ondansetron (ZOFRAN ODT) 4 MG disintegrating tablet  Every 8 hours PRN     06/17/19 0518           Antony MaduraHumes, Ceclia Koker, PA-C 06/17/19 0619    Ward, Layla MawKristen N, DO 06/17/19 0720

## 2020-09-06 ENCOUNTER — Other Ambulatory Visit: Payer: Self-pay

## 2020-09-06 ENCOUNTER — Emergency Department (HOSPITAL_COMMUNITY)
Admission: EM | Admit: 2020-09-06 | Discharge: 2020-09-06 | Disposition: A | Discharge status: Home / Self Care | Payer: Self-pay | Attending: Emergency Medicine | Admitting: Emergency Medicine

## 2020-09-06 ENCOUNTER — Encounter (HOSPITAL_COMMUNITY): Payer: Self-pay | Admitting: Emergency Medicine

## 2020-09-06 ENCOUNTER — Emergency Department (HOSPITAL_COMMUNITY): Payer: Self-pay

## 2020-09-06 DIAGNOSIS — M549 Dorsalgia, unspecified: Secondary | ICD-10-CM | POA: Insufficient documentation

## 2020-09-06 DIAGNOSIS — Z79899 Other long term (current) drug therapy: Secondary | ICD-10-CM | POA: Insufficient documentation

## 2020-09-06 DIAGNOSIS — Z87891 Personal history of nicotine dependence: Secondary | ICD-10-CM | POA: Insufficient documentation

## 2020-09-06 DIAGNOSIS — J449 Chronic obstructive pulmonary disease, unspecified: Secondary | ICD-10-CM | POA: Insufficient documentation

## 2020-09-06 DIAGNOSIS — R0789 Other chest pain: Secondary | ICD-10-CM

## 2020-09-06 DIAGNOSIS — M25511 Pain in right shoulder: Secondary | ICD-10-CM

## 2020-09-06 DIAGNOSIS — R079 Chest pain, unspecified: Secondary | ICD-10-CM | POA: Insufficient documentation

## 2020-09-06 DIAGNOSIS — I10 Essential (primary) hypertension: Secondary | ICD-10-CM | POA: Insufficient documentation

## 2020-09-06 LAB — BASIC METABOLIC PANEL
Anion gap: 12 (ref 5–15)
BUN: 10 mg/dL (ref 6–20)
CO2: 22 mmol/L (ref 22–32)
Calcium: 9.5 mg/dL (ref 8.9–10.3)
Chloride: 103 mmol/L (ref 98–111)
Creatinine, Ser: 0.67 mg/dL (ref 0.44–1.00)
GFR calc Af Amer: >60 mL/min (ref >60)
GFR calc non Af Amer: >60 mL/min (ref >60)
Glucose, Bld: 99 mg/dL (ref 70–99)
Potassium: 4.1 mmol/L (ref 3.5–5.1)
Sodium: 137 mmol/L (ref 135–145)

## 2020-09-06 LAB — TROPONIN I (HIGH SENSITIVITY)
Troponin I (High Sensitivity): 3 ng/L (ref <18)
Troponin I (High Sensitivity): 3 ng/L (ref <18)

## 2020-09-06 LAB — I-STAT BETA HCG BLOOD, ED (MC, WL, AP ONLY): I-stat hCG, quantitative: <5 m[IU]/mL (ref <5)

## 2020-09-06 LAB — CBC
HCT: 42.3 % (ref 36.0–46.0)
Hemoglobin: 13.8 g/dL (ref 12.0–15.0)
MCH: 30.1 pg (ref 26.0–34.0)
MCHC: 32.6 g/dL (ref 30.0–36.0)
MCV: 92.4 fL (ref 80.0–100.0)
Platelets: 299 10*3/uL (ref 150–400)
RBC: 4.58 MIL/uL (ref 3.87–5.11)
RDW: 12.3 % (ref 11.5–15.5)
WBC: 7.1 10*3/uL (ref 4.0–10.5)
nRBC: 0 % (ref 0.0–0.2)

## 2020-09-06 LAB — D-DIMER, QUANTITATIVE: D-Dimer, Quant: <0.27 ug/mL-FEU (ref 0.00–0.50)

## 2020-09-06 MED ORDER — KETOROLAC TROMETHAMINE 15 MG/ML IJ SOLN
15.0000 mg | Freq: Once | INTRAMUSCULAR | Status: AC
  Administered 2020-09-06: 15 mg via INTRAVENOUS
  Filled 2020-09-06: qty 1

## 2020-09-06 MED ORDER — KETOROLAC TROMETHAMINE 15 MG/ML IJ SOLN: 15.0000 mg | Freq: Once | INTRAMUSCULAR | Status: DC

## 2020-09-06 MED ORDER — PREDNISONE 10 MG (21) PO TBPK: ORAL_TABLET | ORAL | 0 refills | Status: DC

## 2020-09-06 MED ORDER — METHOCARBAMOL 500 MG PO TABS
1000.0000 mg | ORAL_TABLET | Freq: Once | ORAL | Status: AC
  Administered 2020-09-06: 1000 mg via ORAL
  Filled 2020-09-06: qty 2

## 2020-09-06 MED ORDER — HYDROCODONE-ACETAMINOPHEN 5-325 MG PO TABS: 1.0000 | ORAL_TABLET | Freq: Four times a day (QID) | ORAL | 0 refills | Status: DC | PRN

## 2020-09-06 MED ORDER — METHOCARBAMOL 750 MG PO TABS: 750.0000 mg | ORAL_TABLET | Freq: Two times a day (BID) | ORAL | 0 refills | Status: DC | PRN

## 2020-09-06 MED ORDER — PREDNISONE 20 MG PO TABS
60.0000 mg | ORAL_TABLET | Freq: Once | ORAL | Status: AC
  Administered 2020-09-06: 60 mg via ORAL
  Filled 2020-09-06: qty 3

## 2020-09-06 NOTE — Discharge Instructions (Addendum)
  Antiinflammatory medications: Take 600 mg of ibuprofen every 6 hours or 440 mg (over the counter dose) to 500 mg (prescription dose) of naproxen every 12 hours for the next 3 days. After this time, these medications may be used as needed for pain. Take these medications with food to avoid upset stomach. Choose only one of these medications, do not take them together. Acetaminophen (generic for Tylenol): Should you continue to have additional pain while taking the ibuprofen or naproxen, you may add in acetaminophen as needed. Your daily total maximum amount of acetaminophen from all sources should be limited to 4000mg /day for persons without liver problems, or 2000mg /day for those with liver problems. Methocarbamol: Methocarbamol (generic for Robaxin) is a muscle relaxer and can help relieve stiff muscles or muscle spasms.  Do not drive or perform other dangerous activities while taking this medication as it can cause drowsiness as well as changes in reaction time and judgement. Lidocaine patches: These are available via either prescription or over-the-counter. The over-the-counter option may be more economical one and are likely just as effective. There are multiple over-the-counter brands, such as Salonpas. Prednisone: Take the prednisone, as prescribed, until finished. If you are a diabetic, please know prednisone can raise your blood sugar temporarily. Ice: May apply ice to the area over the next 24 hours for 15 minutes at a time to reduce pain, inflammation, and swelling, if present. Follow up: Follow up with the orthopedic specialist as soon as possible on this matter. Call to make an appointment.  Return: Return to the ED should symptoms worsen.  For prescription assistance, may try using prescription discount sites or apps, such as goodrx.com

## 2020-09-06 NOTE — ED Provider Notes (Signed)
[facility] EMERGENCY DEPARTMENT Provider Note   CSN: [id number] Arrival date & time: 09/06/20  4580     History Chief Complaint  Patient presents with  . Shoulder Pain  . Back Pain    Gabriella Vance is a 54 y.o. female.  HPI      Gabriella Vance is a 54 y.o. female, with a history of arthritis, COPD, HTN, presenting to the ED with right upper chest and right shoulder pain present upon waking yesterday morning.  Pain is described as a stabbing, moderate to severe, worse with movement of the right shoulder, radiating to the back. Also complains of tightness to the right side of the neck as well as feeling of numbness/tingling in the right upper arm with some movements of the neck or shoulder. Patient is a [employer], however, she states she has not been actively taking trips for the last 2 weeks. Denies fever/chills, cough, other chest pain, abdominal pain, dizziness, syncope, N/V/D, extremity swelling, shortness of breath, extremity weakness, or any other complaints.   Past Medical History:  Diagnosis Date  . Arthritis   . COPD (chronic obstructive pulmonary disease) (HCC)   . Hypertension     There are no problems to display for this patient.   Past Surgical History:  Procedure Laterality Date  . CHOLECYSTECTOMY    . HIATAL HERNIA REPAIR    . LUNG SURGERY    . TENDON REPAIR       OB History   No obstetric history on file.     No family history on file.  Social History   Tobacco Use  . Smoking status: Former Smoker  Substance Use Topics  . Alcohol use: No  . Drug use: No    Home Medications Prior to Admission medications   Medication Sig Start Date End Date Taking? Authorizing Provider  atenolol (TENORMIN) 25 MG tablet Take 25 mg by mouth daily.   Yes [provider]  ibuprofen (ADVIL,MOTRIN) 200 MG tablet Take 400 mg by mouth every 6 (six) hours as needed for mild pain.    Yes [provider]  Multiple  Vitamin (MULTIVITAMIN WITH MINERALS) TABS Take 1 tablet by mouth daily.   Yes [provider]  albuterol (PROVENTIL HFA;VENTOLIN HFA) 108 (90 BASE) MCG/ACT inhaler Inhale 1-2 puffs into the lungs every 6 (six) hours as needed for wheezing or shortness of breath. Patient not taking: Reported on 06/17/2019 08/26/14   Zadie Rhine, MD  guaifenesin (ROBITUSSIN) 100 MG/5ML syrup Take 10 mLs (200 mg total) by mouth 3 (three) times daily as needed for cough or congestion. Patient not taking: Reported on 06/17/2019 09/24/18   Fayrene Helper, PA-C  HYDROcodone-acetaminophen (NORCO/VICODIN) 5-325 MG tablet Take 1-2 tablets by mouth every 6 (six) hours as needed for severe pain. 09/06/20   Anayely Constantine C, PA-C  methocarbamol (ROBAXIN) 750 MG tablet Take 1 tablet (750 mg total) by mouth 2 (two) times daily as needed for muscle spasms (or muscle tightness). 09/06/20   Delano Frate C, PA-C  ondansetron (ZOFRAN ODT) 4 MG disintegrating tablet Take 1 tablet (4 mg total) by mouth every 8 (eight) hours as needed for nausea or vomiting. Patient not taking: Reported on 09/06/2020 06/17/19   Antony Madura, PA-C  predniSONE (STERAPRED UNI-PAK 21 TAB) 10 MG (21) TBPK tablet Take 6 tabs (60mg ) day 1, 5 tabs (50mg ) day 2, 4 tabs (40mg ) day 3, 3 tabs (30mg ) day 4, 2 tabs (20mg ) day 5, and 1 tab (10mg )  day 6. 09/06/20   Tannia Contino C, PA-C    Allergies    Latex  Review of Systems   Review of Systems  Constitutional: Negative for chills, diaphoresis and fever.  HENT: Negative for trouble swallowing.   Respiratory: Negative for cough and shortness of breath.   Cardiovascular: Positive for chest pain. Negative for leg swelling.  Gastrointestinal: Negative for abdominal pain, diarrhea, nausea and vomiting.  Musculoskeletal: Positive for arthralgias. Negative for back pain.  Neurological: Positive for numbness. Negative for dizziness, weakness, light-headedness and headaches.  All other systems reviewed and are  negative.   Physical Exam Updated Vital Signs BP 113/65 (BP Location: Right Arm)   Pulse (!) 57   Temp 98.2 F (36.8 C) (Oral)   Resp 14   Ht 5\' 10"  (1.778 m)   Wt 86.2 kg   SpO2 99%   BMI 27.26 kg/m   Physical Exam Vitals and nursing note reviewed.  Constitutional:      General: She is not in acute distress.    Appearance: She is well-developed. She is not diaphoretic.  HENT:     Head: Normocephalic and atraumatic.     Mouth/Throat:     Mouth: Mucous membranes are moist.     Pharynx: Oropharynx is clear.  Eyes:     Conjunctiva/sclera: Conjunctivae normal.  Cardiovascular:     Rate and Rhythm: Normal rate and regular rhythm.     Pulses: Normal pulses.          Radial pulses are 2+ on the right side and 2+ on the left side.       Posterior tibial pulses are 2+ on the right side and 2+ on the left side.     Heart sounds: Normal heart sounds.     Comments: Tactile temperature in the extremities appropriate and equal bilaterally. Pulmonary:     Effort: Pulmonary effort is normal. No respiratory distress.     Breath sounds: Normal breath sounds.  Abdominal:     Palpations: Abdomen is soft.     Tenderness: There is no abdominal tenderness. There is no guarding.  Musculoskeletal:     Cervical back: Neck supple. No tenderness.     Right lower leg: No edema.     Left lower leg: No edema.     Comments: No tenderness in the right chest, right upper back, or shoulder, however, she does states she has pain in the right shoulder and chest with range of motion of the shoulder, especially with trying to raise her arm greater than 90 degrees abduction. Increased pain with empty can test. She can touch the left shoulder with right hand without noted difficulty, though this movement produces some pain.  Lymphadenopathy:     Cervical: No cervical adenopathy.  Skin:    General: Skin is warm and dry.     Capillary Refill: Capillary refill takes less than 2 seconds.  Neurological:      Mental Status: She is alert.     Comments: Sensation grossly intact to light touch through each of the nerve distributions of the bilateral upper extremities. Abduction and adduction of the fingers intact against resistance. Grip strength equal bilaterally. Supination and pronation intact against resistance. Strength 5/5 through the cardinal directions of the bilateral wrists. Strength 5/5 with flexion and extension of the bilateral elbows. Patient can touch the thumb to each one of the fingertips without difficulty.  Patient can hold the "OK" sign against resistance.  Psychiatric:  Mood and Affect: Mood and affect normal.        Speech: Speech normal.        Behavior: Behavior normal.     ED Results / Procedures / Treatments   Labs (all labs ordered are listed, but only abnormal results are displayed) Labs Reviewed  BASIC METABOLIC PANEL  CBC  D-DIMER, QUANTITATIVE (NOT AT [facility])  I-STAT BETA HCG BLOOD, ED ([facility], [facility], [facility] ONLY)  TROPONIN I (HIGH SENSITIVITY)  TROPONIN I (HIGH SENSITIVITY)    EKG None  ED ECG REPORT   Date: 09/06/2020  Rate: 67  Rhythm: normal sinus rhythm  QRS Axis: normal  Intervals: normal  ST/T Wave abnormalities: normal  Conduction Disutrbances:none  Narrative Interpretation:   Old EKG Reviewed: unchanged  I have personally reviewed the EKG tracing and agree with the computerized printout as noted.  Radiology DG Chest 2 View  Result Date: 09/06/2020 CLINICAL DATA:  Right shoulder and upper chest pain which began yesterday. EXAM: CHEST - 2 VIEW COMPARISON:  PA and lateral chest 09/24/2018.  CT chest 08/26/2014. FINDINGS: The lungs are emphysematous but clear. Heart size is normal. No pneumothorax or pleural fluid. No acute or focal bony abnormality. IMPRESSION: No acute disease. Emphysema (ICD10-J43.9). Electronically Signed   By: Drusilla Kanner M.D.   On: 09/06/2020 09:05    Procedures Procedures (including critical care time)  Medications  Ordered in ED Medications  predniSONE (DELTASONE) tablet 60 mg (60 mg Oral Given 09/06/20 1405)  methocarbamol (ROBAXIN) tablet 1,000 mg (1,000 mg Oral Given 09/06/20 1405)  ketorolac (TORADOL) 15 MG/ML injection 15 mg (15 mg Intravenous Given 09/06/20 1405)    ED Course  I have reviewed the triage vital signs and the nursing notes.  Pertinent labs & imaging results that were available during my care of the patient were reviewed by me and considered in my medical decision making (see chart for details).    MDM Rules/Calculators/A&P                          Patient presents with pain to the right upper chest, right shoulder, and right upper back as well as tightness to the right side of the neck. I suspect her symptoms may be radicular in nature. I personally reviewed and interpreted the patient's labs and imaging studies. Troponin negative.  Single troponin utilized due to constant nature of the patient's pain and the length of duration. D-dimer negative. Other lab results also reassuring.  The patient was given instructions for home care as well as return precautions. Patient voices understanding of these instructions, accepts the plan, and is comfortable with discharge.  Findings and plan of care discussed with Jacalyn Lefevre, MD.     Final Clinical Impression(s) / ED Diagnoses Final diagnoses:  Chest pain radiating to upper extremity  Acute pain of right shoulder    Rx / DC Orders ED Discharge Orders         Ordered    predniSONE (STERAPRED UNI-PAK 21 TAB) 10 MG (21) TBPK tablet        09/06/20 1355    methocarbamol (ROBAXIN) 750 MG tablet  2 times daily PRN        09/06/20 1355    HYDROcodone-acetaminophen (NORCO/VICODIN) 5-325 MG tablet  Every 6 hours PRN        09/06/20 1425           Simren Popson, Hillard Danker, PA-C 09/06/20 1626    Jacalyn Lefevre, MD  09/07/20 0715  

## 2020-09-06 NOTE — ED Triage Notes (Signed)
Pt endorses left shoulder/upper chest pain and back pain that feels like someone is stabbing her. Denies SOB,N/V, dizziness.

## 2021-02-09 ENCOUNTER — Emergency Department (HOSPITAL_COMMUNITY): Payer: Self-pay

## 2021-02-09 ENCOUNTER — Other Ambulatory Visit: Payer: Self-pay

## 2021-02-09 ENCOUNTER — Observation Stay (HOSPITAL_COMMUNITY)
Admission: EM | Admit: 2021-02-09 | Discharge: 2021-02-10 | Disposition: A | Discharge status: Home / Self Care | Payer: Self-pay | Attending: Internal Medicine | Admitting: Internal Medicine

## 2021-02-09 DIAGNOSIS — J449 Chronic obstructive pulmonary disease, unspecified: Secondary | ICD-10-CM | POA: Diagnosis present

## 2021-02-09 DIAGNOSIS — Z20822 Contact with and (suspected) exposure to covid-19: Secondary | ICD-10-CM | POA: Insufficient documentation

## 2021-02-09 DIAGNOSIS — I10 Essential (primary) hypertension: Secondary | ICD-10-CM | POA: Insufficient documentation

## 2021-02-09 DIAGNOSIS — Z87891 Personal history of nicotine dependence: Secondary | ICD-10-CM | POA: Insufficient documentation

## 2021-02-09 DIAGNOSIS — Z79899 Other long term (current) drug therapy: Secondary | ICD-10-CM | POA: Insufficient documentation

## 2021-02-09 DIAGNOSIS — J441 Chronic obstructive pulmonary disease with (acute) exacerbation: Principal | ICD-10-CM | POA: Insufficient documentation

## 2021-02-09 DIAGNOSIS — E872 Acidosis: Secondary | ICD-10-CM | POA: Insufficient documentation

## 2021-02-09 LAB — COMPREHENSIVE METABOLIC PANEL
ALT: 24 U/L (ref 0–44)
AST: 29 U/L (ref 15–41)
Albumin: 4.5 g/dL (ref 3.5–5.0)
Alkaline Phosphatase: 70 U/L (ref 38–126)
Anion gap: 9 (ref 5–15)
BUN: 9 mg/dL (ref 6–20)
CO2: 21 mmol/L — ABNORMAL LOW (ref 22–32)
Calcium: 9.3 mg/dL (ref 8.9–10.3)
Chloride: 106 mmol/L (ref 98–111)
Creatinine, Ser: 0.57 mg/dL (ref 0.44–1.00)
GFR, Estimated: >60 mL/min (ref >60)
Glucose, Bld: 102 mg/dL — ABNORMAL HIGH (ref 70–99)
Potassium: 3.9 mmol/L (ref 3.5–5.1)
Sodium: 136 mmol/L (ref 135–145)
Total Bilirubin: 0.5 mg/dL (ref 0.3–1.2)
Total Protein: 8 g/dL (ref 6.5–8.1)

## 2021-02-09 LAB — CBC WITH DIFFERENTIAL/PLATELET
Abs Immature Granulocytes: 0.03 10*3/uL (ref 0.00–0.07)
Basophils Absolute: 0.1 10*3/uL (ref 0.0–0.1)
Basophils Relative: 1 %
Eosinophils Absolute: 0.8 10*3/uL — ABNORMAL HIGH (ref 0.0–0.5)
Eosinophils Relative: 10 %
HCT: 40.9 % (ref 36.0–46.0)
Hemoglobin: 13.8 g/dL (ref 12.0–15.0)
Immature Granulocytes: 0 %
Lymphocytes Relative: 16 %
Lymphs Abs: 1.2 10*3/uL (ref 0.7–4.0)
MCH: 30.9 pg (ref 26.0–34.0)
MCHC: 33.7 g/dL (ref 30.0–36.0)
MCV: 91.7 fL (ref 80.0–100.0)
Monocytes Absolute: 0.5 10*3/uL (ref 0.1–1.0)
Monocytes Relative: 7 %
Neutro Abs: 5.1 10*3/uL (ref 1.7–7.7)
Neutrophils Relative %: 66 %
Platelets: 290 10*3/uL (ref 150–400)
RBC: 4.46 MIL/uL (ref 3.87–5.11)
RDW: 12.3 % (ref 11.5–15.5)
WBC: 7.7 10*3/uL (ref 4.0–10.5)
nRBC: 0 % (ref 0.0–0.2)

## 2021-02-09 LAB — LACTIC ACID, PLASMA
Lactic Acid, Venous: 3.1 mmol/L (ref 0.5–1.9)
Lactic Acid, Venous: 5.4 mmol/L (ref 0.5–1.9)

## 2021-02-09 LAB — RESP PANEL BY RT-PCR (FLU A&B, COVID) ARPGX2
Influenza A by PCR: NEGATIVE
Influenza B by PCR: NEGATIVE
SARS Coronavirus 2 by RT PCR: NEGATIVE

## 2021-02-09 LAB — D-DIMER, QUANTITATIVE: D-Dimer, Quant: <0.27 ug/mL-FEU (ref 0.00–0.50)

## 2021-02-09 LAB — POC SARS CORONAVIRUS 2 AG -  ED: SARS Coronavirus 2 Ag: NEGATIVE

## 2021-02-09 MED ORDER — ALBUTEROL (5 MG/ML) CONTINUOUS INHALATION SOLN
10.0000 mg/h | INHALATION_SOLUTION | Freq: Once | RESPIRATORY_TRACT | Status: AC
  Administered 2021-02-09: 10 mg/h via RESPIRATORY_TRACT
  Filled 2021-02-09: qty 20

## 2021-02-09 MED ORDER — GUAIFENESIN-DM 100-10 MG/5ML PO SYRP
10.0000 mL | ORAL_SOLUTION | Freq: Three times a day (TID) | ORAL | Status: DC
  Administered 2021-02-09 – 2021-02-10 (×2): 10 mL via ORAL
  Filled 2021-02-09 (×2): qty 10

## 2021-02-09 MED ORDER — MAGNESIUM SULFATE 2 GM/50ML IV SOLN
2.0000 g | Freq: Once | INTRAVENOUS | Status: AC
  Administered 2021-02-09: 2 g via INTRAVENOUS
  Filled 2021-02-09: qty 50

## 2021-02-09 MED ORDER — IPRATROPIUM-ALBUTEROL 0.5-2.5 (3) MG/3ML IN SOLN: 3.0000 mL | RESPIRATORY_TRACT | Status: DC | PRN

## 2021-02-09 MED ORDER — ACETAMINOPHEN 500 MG PO TABS
1000.0000 mg | ORAL_TABLET | Freq: Once | ORAL | Status: AC
  Administered 2021-02-09: 1000 mg via ORAL
  Filled 2021-02-09: qty 2

## 2021-02-09 MED ORDER — ALBUTEROL SULFATE (2.5 MG/3ML) 0.083% IN NEBU
2.5000 mg | INHALATION_SOLUTION | Freq: Once | RESPIRATORY_TRACT | Status: AC
  Administered 2021-02-09: 2.5 mg via RESPIRATORY_TRACT
  Filled 2021-02-09: qty 3

## 2021-02-09 MED ORDER — METHYLPREDNISOLONE SODIUM SUCC 125 MG IJ SOLR
60.0000 mg | Freq: Two times a day (BID) | INTRAMUSCULAR | Status: DC
  Administered 2021-02-10: 60 mg via INTRAVENOUS
  Filled 2021-02-09: qty 2

## 2021-02-09 MED ORDER — ACETAMINOPHEN 650 MG RE SUPP: 650.0000 mg | Freq: Four times a day (QID) | RECTAL | Status: DC | PRN

## 2021-02-09 MED ORDER — IPRATROPIUM-ALBUTEROL 0.5-2.5 (3) MG/3ML IN SOLN
3.0000 mL | Freq: Once | RESPIRATORY_TRACT | Status: AC
  Administered 2021-02-09: 3 mL via RESPIRATORY_TRACT
  Filled 2021-02-09: qty 3

## 2021-02-09 MED ORDER — POLYETHYLENE GLYCOL 3350 17 G PO PACK: 17.0000 g | PACK | Freq: Every day | ORAL | Status: DC | PRN

## 2021-02-09 MED ORDER — BENZONATATE 100 MG PO CAPS
200.0000 mg | ORAL_CAPSULE | Freq: Once | ORAL | Status: AC
  Administered 2021-02-09: 200 mg via ORAL
  Filled 2021-02-09: qty 2

## 2021-02-09 MED ORDER — ACETAMINOPHEN 325 MG PO TABS: 650.0000 mg | ORAL_TABLET | Freq: Four times a day (QID) | ORAL | Status: DC | PRN

## 2021-02-09 MED ORDER — ENOXAPARIN SODIUM 40 MG/0.4ML ~~LOC~~ SOLN
40.0000 mg | SUBCUTANEOUS | Status: DC
  Administered 2021-02-09: 40 mg via SUBCUTANEOUS
  Filled 2021-02-09: qty 0.4

## 2021-02-09 MED ORDER — ONDANSETRON HCL 4 MG/2ML IJ SOLN: 4.0000 mg | Freq: Four times a day (QID) | INTRAMUSCULAR | Status: DC | PRN

## 2021-02-09 MED ORDER — ATENOLOL 25 MG PO TABS
25.0000 mg | ORAL_TABLET | Freq: Every day | ORAL | Status: DC
Start: 2021-02-10 — End: 2021-02-10
  Administered 2021-02-10: 25 mg via ORAL
  Filled 2021-02-09: qty 1

## 2021-02-09 MED ORDER — METHYLPREDNISOLONE SODIUM SUCC 125 MG IJ SOLR
125.0000 mg | Freq: Once | INTRAMUSCULAR | Status: AC
  Administered 2021-02-09: 125 mg via INTRAVENOUS
  Filled 2021-02-09: qty 2

## 2021-02-09 MED ORDER — ALBUTEROL SULFATE HFA 108 (90 BASE) MCG/ACT IN AERS
2.0000 | INHALATION_SPRAY | Freq: Once | RESPIRATORY_TRACT | Status: AC
  Administered 2021-02-09: 2 via RESPIRATORY_TRACT
  Filled 2021-02-09: qty 6.7

## 2021-02-09 MED ORDER — IPRATROPIUM-ALBUTEROL 0.5-2.5 (3) MG/3ML IN SOLN: 3.0000 mL | Freq: Four times a day (QID) | RESPIRATORY_TRACT | Status: DC

## 2021-02-09 MED ORDER — SODIUM CHLORIDE 0.9 % IV BOLUS
1000.0000 mL | Freq: Once | INTRAVENOUS | Status: AC
  Administered 2021-02-09: 1000 mL via INTRAVENOUS

## 2021-02-09 MED ORDER — ONDANSETRON HCL 4 MG PO TABS: 4.0000 mg | ORAL_TABLET | Freq: Four times a day (QID) | ORAL | Status: DC | PRN
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
## 2021-02-09 NOTE — ED Notes (Signed)
Attempted to call report to [phone]. 

## 2021-02-09 NOTE — H&P (Signed)
History and Physical    Gabriella Vance PPJ:[id number] DOB: [birth year] DOA: 02/09/2021  PCP: System, Provider Not In   Patient coming from: Home  I have personally briefly reviewed patient's old medical records in [facility]  Chief Complaint: Difficulty breathing  HPI: Gabriella Vance is a 55 y.o. female with medical history significant for  COPD, HTN.  Patient presented to the ED with complaints of increasing difficulty breathing over the past week.  She was seen at an urgent care and prescribed a course of amoxicillin-started 2/4, steroids, albuterol inhaler without significant improvement in her symptoms.  She reports compliance with her medications. Reports increasing breathing with wheezing and minimally productive cough.  She denies chest pain.  She quit smoking cigarettes about 7 years ago.  ED Course: Temperature 98.2.  Initial heart rate 80s to 90s, increased to 120s after hour-long continuous neb, with ambulation heart rate increased to 160s.  O2 sats greater than 91%.  Lactic acid of 3.1.  Covid test negative.  D-dimer within normal limits 0.27.  Portable chest x-ray shows upper lobe bullous disease and chronic bronchitis.  EKG shows sinus tachycardia at rate of 120. 125 Solu-Medrol given.  Hospitalist to admit.  Review of Systems: As per HPI all other systems reviewed and negative.  Past Medical History:  Diagnosis Date  . Arthritis   . COPD (chronic obstructive pulmonary disease) (HCC)   . Hypertension     Past Surgical History:  Procedure Laterality Date  . CHOLECYSTECTOMY    . HIATAL HERNIA REPAIR    . LUNG SURGERY    . TENDON REPAIR       reports that she has quit smoking. She does not have any smokeless tobacco history on file. She reports that she does not drink alcohol and does not use drugs.  Allergies  Allergen Reactions  . Latex Anaphylaxis, Shortness Of Breath and Rash   Family history of hypertension.  Prior to Admission medications    Medication Sig Start Date End Date Taking? Authorizing Provider  atenolol (TENORMIN) 25 MG tablet Take 25 mg by mouth daily.   Yes [provider]  ibuprofen (ADVIL,MOTRIN) 200 MG tablet Take 400 mg by mouth every 6 (six) hours as needed for mild pain.    Yes [provider]  Multiple Vitamin (MULTIVITAMIN WITH MINERALS) TABS Take 1 tablet by mouth daily.   Yes [provider]  albuterol (PROVENTIL HFA;VENTOLIN HFA) 108 (90 BASE) MCG/ACT inhaler Inhale 1-2 puffs into the lungs every 6 (six) hours as needed for wheezing or shortness of breath. Patient not taking: No sig reported 08/26/14   Zadie Rhine, MD  guaifenesin (ROBITUSSIN) 100 MG/5ML syrup Take 10 mLs (200 mg total) by mouth 3 (three) times daily as needed for cough or congestion. Patient not taking: No sig reported 09/24/18   Fayrene Helper, PA-C  HYDROcodone-acetaminophen (NORCO/VICODIN) 5-325 MG tablet Take 1-2 tablets by mouth every 6 (six) hours as needed for severe pain. Patient not taking: No sig reported 09/06/20   Joy, Shawn C, PA-C  methocarbamol (ROBAXIN) 750 MG tablet Take 1 tablet (750 mg total) by mouth 2 (two) times daily as needed for muscle spasms (or muscle tightness). Patient not taking: No sig reported 09/06/20   Joy, Shawn C, PA-C  ondansetron (ZOFRAN ODT) 4 MG disintegrating tablet Take 1 tablet (4 mg total) by mouth every 8 (eight) hours as needed for nausea or vomiting. Patient not taking: No sig reported 06/17/19   Antony Madura, PA-C  predniSONE (STERAPRED UNI-PAK 21 TAB) 10 MG (21) TBPK tablet Take 6 tabs (60mg ) day 1, 5 tabs (50mg ) day 2, 4 tabs (40mg ) day 3, 3 tabs (30mg ) day 4, 2 tabs (20mg ) day 5, and 1 tab (10mg ) day 6. Patient not taking: No sig reported 09/06/20   , PA-C    Physical Exam: Vitals:   02/09/21 1600 02/09/21 1630 02/09/21 1700 02/09/21 1735  BP: 134/66 135/63 (!) 148/71   Pulse: (!) 112 (!) 108 (!) 120 (!) 161  Resp: 18 18 (!) 22 (!) 34  Temp:       TempSrc:      SpO2: 93% 96% 94% 92%  Weight:      Height:        Constitutional: Moderate respiratory distress Vitals:   02/09/21 1600 02/09/21 1630 02/09/21 1700 02/09/21 1735  BP: 134/66 135/63 (!) 148/71   Pulse: (!) 112 (!) 108 (!) 120 (!) 161  Resp: 18 18 (!) 22 (!) 34  Temp:      TempSrc:      SpO2: 93% 96% 94% 92%  Weight:      Height:       Eyes: PERRL, lids and conjunctivae normal ENMT: Mucous membranes are moist.  Neck: normal, supple, no masses, no thyromegaly Respiratory: Faint expiratory wheeze bilateral upper lobes,  increased work of breathing and use of accessory muscles, patient sitting upright, Cardiovascular: Regular rate and rhythm, no murmurs / rubs / gallops. No extremity edema. 2+ pedal pulses.  Abdomen: no tenderness, no masses palpated. No hepatosplenomegaly. Bowel sounds positive.  Musculoskeletal: no clubbing / cyanosis. No joint deformity upper and lower extremities. Good ROM, no contractures. Normal muscle tone.  Skin: no rashes, lesions, ulcers. No induration Neurologic: No apparent cranial nerve abnormality, moving extremities spontaneously. Psychiatric: Normal judgment and insight. Alert and oriented x 3. Normal mood.   Labs on Admission: I have personally reviewed following labs and imaging studies  CBC: Recent Labs  Lab 02/09/21 0845  WBC 7.7  NEUTROABS 5.1  HGB 13.8  HCT 40.9  MCV 91.7  PLT 290   Basic Metabolic Panel: Recent Labs  Lab 02/09/21 0845  NA 136  K 3.9  CL 106  CO2 21*  GLUCOSE 102*  BUN 9  CREATININE 0.57  CALCIUM 9.3   Liver Function Tests: Recent Labs  Lab 02/09/21 0845  AST 29  ALT 24  ALKPHOS 70  BILITOT 0.5  PROT 8.0  ALBUMIN 4.5   Radiological Exams on Admission: DG Chest Port 1 View  Result Date: 02/09/2021 CLINICAL DATA:  Shortness of breath EXAM: PORTABLE CHEST 1 VIEW COMPARISON:  September 06, 2020 FINDINGS: Postoperative change noted in the right apex region. Relative lucency in the upper  lobes is stable and likely due to underlying bullous disease. There is no appreciable edema or airspace opacity. There are areas of mild interstitial thickening. Heart size is normal. Stable pulmonary vascularity with somewhat diminished vascularity in the upper lobe regions, presumably due to upper lobe bullous disease. No adenopathy. No bone lesions. IMPRESSION: Underlying upper lobe bullous disease. Postoperative change right apex. Interstitial thickening may represent a degree of underlying chronic bronchitis. No edema or airspace opacity. Stable cardiac silhouette. Electronically Signed   By: 04/09/21 III M.D.   On: 02/09/2021 09:41    EKG: Independently reviewed.  Sinus tachycardia rate 120. QTC- 442.  Assessment/Plan Principal Problem:   COPD with acute exacerbation (HCC) Active Problems:   HTN (hypertension)   Acute COPD exacerbation-dyspnea,  cough, wheezing on exam, O2 sats greater than 91% on room air.  Tachycardia likely provoked by hour-long alb nebs.  Portable chest x-ray upper lobe bullous disease, chronic bronchitis, no airspace opacity.  D-dimer within normal limits. -DuoNebs as needed and scheduled -Completed at least 5-day course of amoxicillin as outpatient, will hold off on further antibiotics at this time -IV Solu-Medrol 125 given, continue 60 twice daily -Mucolytic's as needed  Lactic acidosis- 3.1.  At this time no suggestion of infectious etiology. -1 L bolus -Trend  HTN- Stable - resume atenolol  DVT prophylaxis: Lovenox Code Status: Full code Family Communication: Spouse at bedside. Disposition Plan:  ~ 1 - 2 days Consults called: None Admission status:  Obs, tele  Onnie Boer MD [facility]  02/09/2021, 8:50 PM

## 2021-02-09 NOTE — ED Notes (Signed)
Per Burgess Amor, patient given soft drink with crackers and peanut butter.

## 2021-02-09 NOTE — ED Notes (Signed)
Pt IV removed due to uncomfortable.  Will restart IV once pt is done eating.

## 2021-02-09 NOTE — ED Notes (Signed)
CRITICAL VALUE ALERT  Critical Value:  Lactic 3.1  Date & Time Notied:  02/10/2020 2023  Provider Notified: Dr. Estell Harpin  Orders Received/Actions taken: see chart

## 2021-02-09 NOTE — ED Notes (Signed)
Attempted to call report x 1  

## 2021-02-09 NOTE — ED Triage Notes (Signed)
Pt complains of SOB was seen at urgent care Friday and is no better, reports to COVID -, pt was given inhaler at urgent care.  Pt coughing and wheezing during triage.

## 2021-02-10 ENCOUNTER — Encounter (HOSPITAL_COMMUNITY): Payer: Self-pay | Admitting: Internal Medicine

## 2021-02-10 DIAGNOSIS — J441 Chronic obstructive pulmonary disease with (acute) exacerbation: Principal | ICD-10-CM

## 2021-02-10 DIAGNOSIS — I10 Essential (primary) hypertension: Secondary | ICD-10-CM

## 2021-02-10 LAB — HIV ANTIBODY (ROUTINE TESTING W REFLEX): HIV Screen 4th Generation wRfx: NONREACTIVE

## 2021-02-10 MED ORDER — PREDNISONE 20 MG PO TABS: 60.0000 mg | ORAL_TABLET | Freq: Every day | ORAL | Status: DC

## 2021-02-10 MED ORDER — ALBUTEROL SULFATE HFA 108 (90 BASE) MCG/ACT IN AERS: 2.0000 | INHALATION_SPRAY | Freq: Four times a day (QID) | RESPIRATORY_TRACT | 0 refills | Status: DC | PRN

## 2021-02-10 MED ORDER — PREDNISONE 10 MG PO TABS: 60.0000 mg | ORAL_TABLET | Freq: Every day | ORAL | 0 refills | Status: DC

## 2021-02-10 NOTE — Discharge Summary (Signed)
Physician Discharge Summary  Gabriella Vance NWG:[id number] DOB: 11-05-1966 DOA: 02/09/2021  PCP: System, Provider Not In  Admit date: 02/09/2021 Discharge date: 02/10/2021  Admitted From:Home Disposition:  Home   Recommendations for Outpatient Follow-up:  1. Follow up with PCP in 1-2 weeks 2. Please obtain BMP/CBC in one week    Discharge Condition: Stable CODE STATUS: FULL Diet recommendation: Regular   Brief/Interim Summary: 55 year old female with a history of COPD, hypertension presenting with nonproductive cough and shortness of breath that began approximately 2 weeks prior to admission. The patient has a history of smoking with about 30 pack years. She quit about 8 years ago and currently vapes. She went to urgent care on 02/03/2021 and was prescribed amoxicillin and an albuterol inhaler. Shortness of breath and coughing did not improve and worsened. As result, she presented for further evaluation. She denied any fevers, chills, chest pain, hemoptysis, nausea, vomiting, direct abdominal pain. In the emergency department, the patient was tachycardic. She remained short of breath with wheezing. As result, the patient was admitted for further evaluation. The patient was started on bronchodilators and Solu-Medrol. She improved clinically. She remained stable on room air. On the day of discharge, ambulatory pulse ox on room air did not show any desaturation. The patient will be discharged home with a prednisone taper and an albuterol inhaler.  Discharge Diagnoses:  COPD exacerbation -Stable on room air -Patient was started on Solu-Medrol with improvement -Patient was started on bronchodilators with improvement -Discharge home with prednisone taper and albuterol inhaler  Essential hypertension -Controlled -Continue atenolol   Discharge Instructions   Allergies as of 02/10/2021      Reactions   Latex Anaphylaxis, Shortness Of Breath, Rash      Medication List    TAKE these  medications   albuterol 108 (90 Base) MCG/ACT inhaler Commonly known as: VENTOLIN HFA Inhale 2 puffs into the lungs every 6 (six) hours as needed for wheezing or shortness of breath. What changed: how much to take   atenolol 25 MG tablet Commonly known as: TENORMIN Take 25 mg by mouth daily.   ibuprofen 200 MG tablet Commonly known as: ADVIL Take 400 mg by mouth every 6 (six) hours as needed for mild pain.   multivitamin with minerals Tabs tablet Take 1 tablet by mouth daily.   predniSONE 10 MG tablet Commonly known as: DELTASONE Take 6 tablets (60 mg total) by mouth daily with breakfast. And decrease by one tablet daily Start taking on: February 11, 2021       Allergies  Allergen Reactions  . Latex Anaphylaxis, Shortness Of Breath and Rash    Consultations:  none   Procedures/Studies: DG Chest Port 1 View  Result Date: 02/09/2021 CLINICAL DATA:  Shortness of breath EXAM: PORTABLE CHEST 1 VIEW COMPARISON:  September 06, 2020 FINDINGS: Postoperative change noted in the right apex region. Relative lucency in the upper lobes is stable and likely due to underlying bullous disease. There is no appreciable edema or airspace opacity. There are areas of mild interstitial thickening. Heart size is normal. Stable pulmonary vascularity with somewhat diminished vascularity in the upper lobe regions, presumably due to upper lobe bullous disease. No adenopathy. No bone lesions. IMPRESSION: Underlying upper lobe bullous disease. Postoperative change right apex. Interstitial thickening may represent a degree of underlying chronic bronchitis. No edema or airspace opacity. Stable cardiac silhouette. Electronically Signed   By: Bretta Bang III M.D.   On: 02/09/2021 09:41        Discharge  Exam: Vitals:   02/10/21 0139 02/10/21 0514  BP: 128/72 126/77  Pulse: 67 66  Resp: 18 18  Temp: 97.8 F (36.6 C) 98 F (36.7 C)  SpO2: 96% 95%   Vitals:   02/09/21 2037 02/09/21 2047  02/10/21 0139 02/10/21 0514  BP: 135/79  128/72 126/77  Pulse: 96  67 66  Resp: 18  18 18   Temp: 97.6 F (36.4 C)  97.8 F (36.6 C) 98 F (36.7 C)  TempSrc: Oral  Oral Oral  SpO2: 96% 94% 96% 95%  Weight:      Height:        General: Pt is alert, awake, not in acute distress Cardiovascular: RRR, S1/S2 +, no rubs, no gallops Respiratory: diminished BS.  Bibasilar crackles. No wheeze Abdominal: Soft, NT, ND, bowel sounds + Extremities: no edema, no cyanosis   The results of significant diagnostics from this hospitalization (including imaging, microbiology, ancillary and laboratory) are listed below for reference.    Significant Diagnostic Studies: DG Chest Port 1 View  Result Date: 02/09/2021 CLINICAL DATA:  Shortness of breath EXAM: PORTABLE CHEST 1 VIEW COMPARISON:  September 06, 2020 FINDINGS: Postoperative change noted in the right apex region. Relative lucency in the upper lobes is stable and likely due to underlying bullous disease. There is no appreciable edema or airspace opacity. There are areas of mild interstitial thickening. Heart size is normal. Stable pulmonary vascularity with somewhat diminished vascularity in the upper lobe regions, presumably due to upper lobe bullous disease. No adenopathy. No bone lesions. IMPRESSION: Underlying upper lobe bullous disease. Postoperative change right apex. Interstitial thickening may represent a degree of underlying chronic bronchitis. No edema or airspace opacity. Stable cardiac silhouette. Electronically Signed   By: September 08, 2020 III M.D.   On: 02/09/2021 09:41     Microbiology: Recent Results (from the past 240 hour(s))  Resp Panel by RT-PCR (Flu A&B, Covid) Nasopharyngeal Swab     Status: None   Collection Time: 02/09/21  9:31 AM   Specimen: Nasopharyngeal Swab; Nasopharyngeal(NP) swabs in vial transport medium  Result Value Ref Range Status   SARS Coronavirus 2 by RT PCR NEGATIVE NEGATIVE Final    Comment:  (NOTE) SARS-CoV-2 target nucleic acids are NOT DETECTED.  The SARS-CoV-2 RNA is generally detectable in upper respiratory specimens during the acute phase of infection. The lowest concentration of SARS-CoV-2 viral copies this assay can detect is 138 copies/mL. A negative result does not preclude SARS-Cov-2 infection and should not be used as the sole basis for treatment or other patient management decisions. A negative result may occur with  improper specimen collection/handling, submission of specimen other than nasopharyngeal swab, presence of viral mutation(s) within the areas targeted by this assay, and inadequate number of viral copies(<138 copies/mL). A negative result must be combined with clinical observations, patient history, and epidemiological information. The expected result is Negative.  Fact Sheet for Patients:  04/09/21  Fact Sheet for Healthcare Providers:  BloggerCourse.com  This test is no t yet approved or cleared by the [employer] FDA and  has been authorized for detection and/or diagnosis of SARS-CoV-2 by FDA under an Emergency Use Authorization (EUA). This EUA will remain  in effect (meaning this test can be used) for the duration of the COVID-19 declaration under Section 564(b)(1) of the Act, 21 U.S.C.section 360bbb-3(b)(1), unless the authorization is terminated  or revoked sooner.       Influenza A by PCR NEGATIVE NEGATIVE Final   Influenza B by PCR NEGATIVE  NEGATIVE Final    Comment: (NOTE) The Xpert Xpress SARS-CoV-2/FLU/RSV plus assay is intended as an aid in the diagnosis of influenza from Nasopharyngeal swab specimens and should not be used as a sole basis for treatment. Nasal washings and aspirates are unacceptable for Xpert Xpress SARS-CoV-2/FLU/RSV testing.  Fact Sheet for Patients: BloggerCourse.com  Fact Sheet for Healthcare  Providers: [employer]ious[employer]ker[employer]it  This test is not yet approved or cleared by the [place] FDA and has been authorized for detection and/or diagnosis of SARS-CoV-2 by FDA under an Emergency Use Authorization (EUA). This EUA will remain in effect (meaning this test can be used) for the duration of the COVID-19 declaration under Section 564(b)(1) of the Act, 21 U.S.C. section 360bbb-3(b)(1), unless the authorization is terminated or revoked.  Performed at [facility], [address], [place], [place] [address]      Labs: Basic Metabolic Panel: Recent Labs  Lab 02/09/21 0845  NA 136  K 3.9  CL 106  CO2 21*  GLUCOSE 102*  BUN 9  CREATININE 0.57  CALCIUM 9.3   Liver Function Tests: Recent Labs  Lab 02/09/21 0845  AST 29  ALT 24  ALKPHOS 70  BILITOT 0.5  PROT 8.0  ALBUMIN 4.5   No results for input(s): LIPASE, AMYLASE in the last 168 hours. No results for input(s): AMMONIA in the last 168 hours. CBC: Recent Labs  Lab 02/09/21 0845  WBC 7.7  NEUTROABS 5.1  HGB 13.8  HCT 40.9  MCV 91.7  PLT 290   Cardiac Enzymes: No results for input(s): CKTOTAL, CKMB, CKMBINDEX, TROPONINI in the last 168 hours. BNP: Invalid input(s): POCBNP CBG: No results for input(s): GLUCAP in the last 168 hours.  Time coordinating discharge:  36 minutes  Signed:  Catarina Hartshorn, DO [facility] Pager: [phone] 02/10/2021, 9:39 AM

## 2021-04-10 ENCOUNTER — Ambulatory Visit (INDEPENDENT_AMBULATORY_CARE_PROVIDER_SITE_OTHER): Payer: Self-pay | Admitting: Internal Medicine

## 2021-04-10 ENCOUNTER — Encounter: Payer: Self-pay | Admitting: Internal Medicine

## 2021-04-10 ENCOUNTER — Other Ambulatory Visit: Payer: Self-pay

## 2021-04-10 VITALS — BP 157/71 | HR 76 | Resp 18 | Ht 70.0 in | Wt 187.1 lb

## 2021-04-10 DIAGNOSIS — M5030 Other cervical disc degeneration, unspecified cervical region: Secondary | ICD-10-CM | POA: Insufficient documentation

## 2021-04-10 DIAGNOSIS — G8929 Other chronic pain: Secondary | ICD-10-CM

## 2021-04-10 DIAGNOSIS — I10 Essential (primary) hypertension: Secondary | ICD-10-CM

## 2021-04-10 DIAGNOSIS — J449 Chronic obstructive pulmonary disease, unspecified: Secondary | ICD-10-CM

## 2021-04-10 DIAGNOSIS — Z902 Acquired absence of lung [part of]: Secondary | ICD-10-CM

## 2021-04-10 DIAGNOSIS — Z8739 Personal history of other diseases of the musculoskeletal system and connective tissue: Secondary | ICD-10-CM | POA: Insufficient documentation

## 2021-04-10 DIAGNOSIS — Z7689 Persons encountering health services in other specified circumstances: Secondary | ICD-10-CM

## 2021-04-10 DIAGNOSIS — M542 Cervicalgia: Secondary | ICD-10-CM

## 2021-04-10 DIAGNOSIS — Z9109 Other allergy status, other than to drugs and biological substances: Secondary | ICD-10-CM

## 2021-04-10 MED ORDER — GABAPENTIN 100 MG PO CAPS
100.0000 mg | ORAL_CAPSULE | Freq: Every day | ORAL | 3 refills | Status: DC
Start: 2021-04-10 — End: 2021-05-25

## 2021-04-10 MED ORDER — ATENOLOL 50 MG PO TABS: 50.0000 mg | ORAL_TABLET | Freq: Every day | ORAL | 1 refills | Status: DC

## 2021-04-10 MED ORDER — MONTELUKAST SODIUM 10 MG PO TABS: 10.0000 mg | ORAL_TABLET | Freq: Every day | ORAL | 3 refills | Status: DC

## 2021-04-10 NOTE — Assessment & Plan Note (Signed)
BP Readings from Last 1 Encounters:  04/10/21 (!) 157/71   Uncontrolled with Atenolol 25 mg QD Increased dose to Atenolol 50 mg QD Patient has tried multiple different medications in the past, did not tolerate them. Counseled for compliance with the medications Advised DASH diet and moderate exercise/walking, at least 150 mins/week
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
## 2021-04-10 NOTE — Assessment & Plan Note (Signed)
Nontraumatic Tylenol/Ibuprofen PRN Gabapentin can also help with neuropathic pain

## 2021-04-10 NOTE — Patient Instructions (Signed)
Please start taking Atenolol 50 mg instead of 25 mg.  Please start taking Gabapentin at nighttime for chronic neck pain and shoulder pain.  Please start taking Singulair for allergies.  Please schedule an appointment with your OB/GYN for PAP smear.  Please bring the recent test results in the next visit.

## 2021-04-10 NOTE — Assessment & Plan Note (Signed)
Care established History and medications reviewed with the patient 

## 2021-04-10 NOTE — Assessment & Plan Note (Signed)
On Allegra, does not control allergic symptoms Has tried Claritin and Zyrtec Started Singulair

## 2021-04-10 NOTE — Assessment & Plan Note (Signed)
Well-controlled with Albuterol PRN 

## 2021-04-10 NOTE — Assessment & Plan Note (Signed)
Reports h/o DDD of cervical spine Tylenol/Ibuprofen PRN Started Gabapentin 100 mg qHS

## 2021-04-11 ENCOUNTER — Other Ambulatory Visit: Payer: Self-pay | Admitting: Internal Medicine

## 2021-04-11 DIAGNOSIS — E782 Mixed hyperlipidemia: Secondary | ICD-10-CM

## 2021-04-11 LAB — CBC WITH DIFFERENTIAL/PLATELET
Basophils Absolute: 0.1 10*3/uL (ref 0.0–0.2)
Basos: 1 %
EOS (ABSOLUTE): 0.3 10*3/uL (ref 0.0–0.4)
Eos: 3 %
Hematocrit: 41.8 % (ref 34.0–46.6)
Hemoglobin: 14.4 g/dL (ref 11.1–15.9)
Immature Grans (Abs): 0.1 10*3/uL (ref 0.0–0.1)
Immature Granulocytes: 1 %
Lymphocytes Absolute: 1.8 10*3/uL (ref 0.7–3.1)
Lymphs: 20 %
MCH: 31.4 pg (ref 26.6–33.0)
MCHC: 34.4 g/dL (ref 31.5–35.7)
MCV: 91 fL (ref 79–97)
Monocytes Absolute: 0.6 10*3/uL (ref 0.1–0.9)
Monocytes: 7 %
Neutrophils Absolute: 6.2 10*3/uL (ref 1.4–7.0)
Neutrophils: 68 %
Platelets: 290 10*3/uL (ref 150–450)
RBC: 4.59 x10E6/uL (ref 3.77–5.28)
RDW: 12.9 % (ref 11.7–15.4)
WBC: 9.1 10*3/uL (ref 3.4–10.8)

## 2021-04-11 LAB — BASIC METABOLIC PANEL
BUN/Creatinine Ratio: 16 (ref 9–23)
BUN: 11 mg/dL (ref 6–24)
CO2: 20 mmol/L (ref 20–29)
Calcium: 10.3 mg/dL — ABNORMAL HIGH (ref 8.7–10.2)
Chloride: 102 mmol/L (ref 96–106)
Creatinine, Ser: 0.69 mg/dL (ref 0.57–1.00)
Glucose: 84 mg/dL (ref 65–99)
Potassium: 4.7 mmol/L (ref 3.5–5.2)
Sodium: 141 mmol/L (ref 134–144)
eGFR: 102 mL/min/{1.73_m2} (ref >59)

## 2021-04-11 LAB — LIPID PANEL
Chol/HDL Ratio: 6.8 ratio — ABNORMAL HIGH (ref 0.0–4.4)
Cholesterol, Total: 260 mg/dL — ABNORMAL HIGH (ref 100–199)
HDL: 38 mg/dL — ABNORMAL LOW (ref >39)
LDL Chol Calc (NIH): 119 mg/dL — ABNORMAL HIGH (ref 0–99)
Triglycerides: 580 mg/dL (ref 0–149)
VLDL Cholesterol Cal: 103 mg/dL — ABNORMAL HIGH (ref 5–40)

## 2021-04-11 MED ORDER — ROSUVASTATIN CALCIUM 10 MG PO TABS: 10.0000 mg | ORAL_TABLET | Freq: Every day | ORAL | 1 refills | Status: DC

## 2021-05-22 ENCOUNTER — Ambulatory Visit: Payer: Self-pay | Admitting: Internal Medicine

## 2021-05-25 ENCOUNTER — Ambulatory Visit (INDEPENDENT_AMBULATORY_CARE_PROVIDER_SITE_OTHER): Payer: Self-pay | Admitting: Internal Medicine

## 2021-05-25 ENCOUNTER — Encounter: Payer: Self-pay | Admitting: Internal Medicine

## 2021-05-25 ENCOUNTER — Other Ambulatory Visit: Payer: Self-pay

## 2021-05-25 VITALS — BP 133/74 | HR 66 | Temp 97.7°F | Resp 20 | Ht 70.0 in | Wt 184.0 lb

## 2021-05-25 DIAGNOSIS — E782 Mixed hyperlipidemia: Secondary | ICD-10-CM | POA: Insufficient documentation

## 2021-05-25 DIAGNOSIS — I10 Essential (primary) hypertension: Secondary | ICD-10-CM

## 2021-05-25 DIAGNOSIS — Z9109 Other allergy status, other than to drugs and biological substances: Secondary | ICD-10-CM

## 2021-05-25 DIAGNOSIS — M542 Cervicalgia: Secondary | ICD-10-CM

## 2021-05-25 DIAGNOSIS — G8929 Other chronic pain: Secondary | ICD-10-CM

## 2021-05-25 DIAGNOSIS — Z8739 Personal history of other diseases of the musculoskeletal system and connective tissue: Secondary | ICD-10-CM

## 2021-05-25 MED ORDER — GABAPENTIN 100 MG PO CAPS: 200.0000 mg | ORAL_CAPSULE | Freq: Every day | ORAL | 5 refills | Status: DC

## 2021-05-25 NOTE — Patient Instructions (Addendum)
Please start taking Gabapentin 200 mg instead of 100 mg.  Please continue taking other medications as prescribed.  Please follow low salt and low cholesterol diet.   [email or web address]">  DASH Eating Plan DASH stands for Dietary Approaches to Stop Hypertension. The DASH eating plan is a healthy eating plan that has been shown to:  Reduce high blood pressure (hypertension).  Reduce your risk for type 2 diabetes, heart disease, and stroke.  Help with weight loss. What are tips for following this plan? Reading food labels  Check food labels for the amount of salt (sodium) per serving. Choose foods with less than 5 percent of the Daily Value of sodium. Generally, foods with less than 300 milligrams (mg) of sodium per serving fit into this eating plan.  To find whole grains, look for the word "whole" as the first word in the ingredient list. Shopping  Buy products labeled as "low-sodium" or "no salt added."  Buy fresh foods. Avoid canned foods and pre-made or frozen meals. Cooking  Avoid adding salt when cooking. Use salt-free seasonings or herbs instead of table salt or sea salt. Check with your health care provider or pharmacist before using salt substitutes.  Do not fry foods. Cook foods using healthy methods such as baking, boiling, grilling, roasting, and broiling instead.  Cook with heart-healthy oils, such as olive, canola, avocado, soybean, or sunflower oil. Meal planning  Eat a balanced diet that includes: ? 4 or more servings of fruits and 4 or more servings of vegetables each day. Try to fill one-half of your plate with fruits and vegetables. ? 6-8 servings of whole grains each day. ? Less than 6 oz (170 g) of lean meat, poultry, or fish each day. A 3-oz (85-g) serving of meat is about the same size as a deck of cards. One egg equals 1 oz (28 g). ? 2-3 servings of low-fat dairy each day. One serving is 1 cup (237 mL). ? 1  serving of nuts, seeds, or beans 5 times each week. ? 2-3 servings of heart-healthy fats. Healthy fats called omega-3 fatty acids are found in foods such as walnuts, flaxseeds, fortified milks, and eggs. These fats are also found in cold-water fish, such as sardines, salmon, and mackerel.  Limit how much you eat of: ? Canned or prepackaged foods. ? Food that is high in trans fat, such as some fried foods. ? Food that is high in saturated fat, such as fatty meat. ? Desserts and other sweets, sugary drinks, and other foods with added sugar. ? Full-fat dairy products.  Do not salt foods before eating.  Do not eat more than 4 egg yolks a week.  Try to eat at least 2 vegetarian meals a week.  Eat more home-cooked food and less restaurant, buffet, and fast food.   Lifestyle  When eating at a restaurant, ask that your food be prepared with less salt or no salt, if possible.  If you drink alcohol: ? Limit how much you use to:  0-1 drink a day for women who are not pregnant.  0-2 drinks a day for men. ? Be aware of how much alcohol is in your drink. In the [place] one drink equals one 12 oz bottle of beer (355 mL), one 5 oz glass of wine (148 mL), or one 1 oz glass of hard liquor (44 mL). General information  Avoid eating more than 2,300 mg of salt a day. If you have hypertension, you may need to reduce your sodium intake  to 1,500 mg a day.  Work with your health care provider to maintain a healthy body weight or to lose weight. Ask what an ideal weight is for you.  Get at least 30 minutes of exercise that causes your heart to beat faster (aerobic exercise) most days of the week. Activities may include walking, swimming, or biking.  Work with your health care provider or dietitian to adjust your eating plan to your individual calorie needs. What foods should I eat? Fruits All fresh, dried, or frozen fruit. Canned fruit in natural juice (without added sugar). Vegetables Fresh or frozen  vegetables (raw, steamed, roasted, or grilled). Low-sodium or reduced-sodium tomato and vegetable juice. Low-sodium or reduced-sodium tomato sauce and tomato paste. Low-sodium or reduced-sodium canned vegetables. Grains Whole-grain or whole-wheat bread. Whole-grain or whole-wheat pasta. Brown rice. Orpah Cobb. Bulgur. Whole-grain and low-sodium cereals. Pita bread. Low-fat, low-sodium crackers. Whole-wheat flour tortillas. Meats and other proteins Skinless chicken or [place]. Ground chicken or [place]. Pork with fat trimmed off. Fish and seafood. Egg whites. Dried beans, peas, or lentils. Unsalted nuts, nut butters, and seeds. Unsalted canned beans. Lean cuts of beef with fat trimmed off. Low-sodium, lean precooked or cured meat, such as sausages or meat loaves. Dairy Low-fat (1%) or fat-free (skim) milk. Reduced-fat, low-fat, or fat-free cheeses. Nonfat, low-sodium ricotta or cottage cheese. Low-fat or nonfat yogurt. Low-fat, low-sodium cheese. Fats and oils Soft margarine without trans fats. Vegetable oil. Reduced-fat, low-fat, or light mayonnaise and salad dressings (reduced-sodium). Canola, safflower, olive, avocado, soybean, and sunflower oils. Avocado. Seasonings and condiments Herbs. Spices. Seasoning mixes without salt. Other foods Unsalted popcorn and pretzels. Fat-free sweets. The items listed above may not be a complete list of foods and beverages you can eat. Contact a dietitian for more information. What foods should I avoid? Fruits Canned fruit in a light or heavy syrup. Fried fruit. Fruit in cream or butter sauce. Vegetables Creamed or fried vegetables. Vegetables in a cheese sauce. Regular canned vegetables (not low-sodium or reduced-sodium). Regular canned tomato sauce and paste (not low-sodium or reduced-sodium). Regular tomato and vegetable juice (not low-sodium or reduced-sodium). Rosita Fire. Olives. Grains Baked goods made with fat, such as croissants, muffins, or some  breads. Dry pasta or rice meal packs. Meats and other proteins Fatty cuts of meat. Ribs. Fried meat. Tomasa Blase. Bologna, salami, and other precooked or cured meats, such as sausages or meat loaves. Fat from the back of a pig (fatback). Bratwurst. Salted nuts and seeds. Canned beans with added salt. Canned or smoked fish. Whole eggs or egg yolks. Chicken or [place] with skin. Dairy Whole or 2% milk, cream, and half-and-half. Whole or full-fat cream cheese. Whole-fat or sweetened yogurt. Full-fat cheese. Nondairy creamers. Whipped toppings. Processed cheese and cheese spreads. Fats and oils Butter. Stick margarine. Lard. Shortening. Ghee. Bacon fat. Tropical oils, such as coconut, palm kernel, or palm oil. Seasonings and condiments Onion salt, garlic salt, seasoned salt, table salt, and sea salt. Worcestershire sauce. Tartar sauce. Barbecue sauce. Teriyaki sauce. Soy sauce, including reduced-sodium. Steak sauce. Canned and packaged gravies. Fish sauce. Oyster sauce. Cocktail sauce. Store-bought horseradish. Ketchup. Mustard. Meat flavorings and tenderizers. Bouillon cubes. Hot sauces. Pre-made or packaged marinades. Pre-made or packaged taco seasonings. Relishes. Regular salad dressings. Other foods Salted popcorn and pretzels. The items listed above may not be a complete list of foods and beverages you should avoid. Contact a dietitian for more information. Where to find more information  National Heart, Lung, and Blood Institute: PopSteam.is  American Heart Association:  www.heart.org  Academy of Nutrition and Dietetics: www.eatright.[place]: www.kidney.org Summary  The DASH eating plan is a healthy eating plan that has been shown to reduce high blood pressure (hypertension). It may also reduce your risk for type 2 diabetes, heart disease, and stroke.  When on the DASH eating plan, aim to eat more fresh fruits and vegetables, whole grains, lean proteins, low-fat  dairy, and heart-healthy fats.  With the DASH eating plan, you should limit salt (sodium) intake to 2,300 mg a day. If you have hypertension, you may need to reduce your sodium intake to 1,500 mg a day.  Work with your health care provider or dietitian to adjust your eating plan to your individual calorie needs. This information is not intended to replace advice given to you by your health care provider. Make sure you discuss any questions you have with your health care provider. Document Revised: 11/20/2019 Document Reviewed: 11/20/2019 Elsevier Patient Education  2021 [employer].

## 2021-05-25 NOTE — Assessment & Plan Note (Signed)
Nontraumatic Tylenol/Ibuprofen PRN Gabapentin can also help with neuropathic pain

## 2021-05-25 NOTE — Assessment & Plan Note (Signed)
Reports h/o DDD of cervical spine Tylenol/Ibuprofen PRN Increased Gabapentin to 200 mg qHS

## 2021-05-25 NOTE — Assessment & Plan Note (Signed)
Lipid profile reviewed Started Crestor Repeat lipid profile before next visit

## 2021-05-25 NOTE — Assessment & Plan Note (Signed)
BP Readings from Last 1 Encounters:  05/25/21 133/74   Well-controlled with Atenolol Counseled for compliance with the medications Advised DASH diet and moderate exercise/walking, at least 150 mins/week

## 2021-05-25 NOTE — Assessment & Plan Note (Signed)
Well-controlled with Singulair now

## 2021-05-25 NOTE — Progress Notes (Signed)
Established Patient Office Visit  Subjective:  Patient ID: Gabriella Vance, female    DOB: January 06, 1966  Age: 55 y.o. MRN: [id number]  CC:  Chief Complaint  Patient presents with  . Hypertension  . Hyperlipidemia    HPI Gabriella Vance is a 55 year old female with PMH of HTN, COPD, environmental allergies, DDD of cervical spine and rotator cuff tear who presents for follow up of her chronic medical conditions.  HTN: BP is well-controlled now. Takes medications regularly. Patient denies headache, dizziness, chest pain, dyspnea or palpitations.  Allergies: Well-controlled now with Singulair.  Neck pain: She felt better with Gabapentin, but states that she still has pain. Denies any dizziness while taking Gabapentin.  She has started taking Crestor for HLD.  She has brought screening tests done with a private firm, which have been reviewed.  Past Medical History:  Diagnosis Date  . Arthritis   . COPD (chronic obstructive pulmonary disease) ([place])   . Hypertension     Past Surgical History:  Procedure Laterality Date  . CHOLECYSTECTOMY    . HIATAL HERNIA REPAIR    . LUNG SURGERY    . TENDON REPAIR      No family history on file.  Social History   Socioeconomic History  . Marital status: Married    Spouse name: Not on file  . Number of children: Not on file  . Years of education: Not on file  . Highest education level: Not on file  Occupational History  . Not on file  Tobacco Use  . Smoking status: Former Research scientist (life sciences)  . Smokeless tobacco: Never Used  Substance and Sexual Activity  . Alcohol use: No  . Drug use: No  . Sexual activity: Not on file  Other Topics Concern  . Not on file  Social History Narrative  . Not on file   Social Determinants of Health   Financial Resource Strain: Not on file  Food Insecurity: Not on file  Transportation Needs: Not on file  Physical Activity: Not on file  Stress: Not on file  Social Connections: Not on file  Intimate  Partner Violence: Not on file    Outpatient Medications Prior to Visit  Medication Sig Dispense Refill  . albuterol (VENTOLIN HFA) 108 (90 Base) MCG/ACT inhaler Inhale 2 puffs into the lungs every 6 (six) hours as needed for wheezing or shortness of breath. 1 each 0  . atenolol (TENORMIN) 50 MG tablet Take 1 tablet (50 mg total) by mouth daily. 30 tablet 1  . fexofenadine (ALLEGRA) 180 MG tablet Take 180 mg by mouth daily.    Marland Kitchen ibuprofen (ADVIL,MOTRIN) 200 MG tablet Take 400 mg by mouth every 6 (six) hours as needed for mild pain.     . montelukast (SINGULAIR) 10 MG tablet Take 1 tablet (10 mg total) by mouth at bedtime. 30 tablet 3  . Multiple Vitamin (MULTIVITAMIN WITH MINERALS) TABS Take 1 tablet by mouth daily.    . rosuvastatin (CRESTOR) 10 MG tablet Take 1 tablet (10 mg total) by mouth daily. 90 tablet 1  . gabapentin (NEURONTIN) 100 MG capsule Take 1 capsule (100 mg total) by mouth at bedtime. 30 capsule 3   No facility-administered medications prior to visit.    Allergies  Allergen Reactions  . Latex Anaphylaxis, Shortness Of Breath and Rash    ROS Review of Systems  Constitutional: Negative for chills and fever.  HENT: Negative for congestion, sinus pressure, sinus pain and sore throat.   Eyes: Negative for  pain and discharge.  Respiratory: Negative for cough and shortness of breath.   Cardiovascular: Negative for chest pain and palpitations.  Gastrointestinal: Negative for abdominal pain, constipation, diarrhea, nausea and vomiting.  Endocrine: Negative for polydipsia and polyuria.  Genitourinary: Negative for dysuria and hematuria.  Musculoskeletal: Negative for neck pain and neck stiffness.  Skin: Negative for rash.  Allergic/Immunologic: Positive for environmental allergies.  Neurological: Negative for dizziness and weakness.  Psychiatric/Behavioral: Negative for agitation and behavioral problems.      Objective:    Physical Exam Vitals reviewed.   Constitutional:      General: She is not in acute distress.    Appearance: She is not diaphoretic.  HENT:     Head: Normocephalic and atraumatic.     Nose: Nose normal.     Mouth/Throat:     Mouth: Mucous membranes are moist.  Eyes:     General: No scleral icterus.    Extraocular Movements: Extraocular movements intact.  Cardiovascular:     Rate and Rhythm: Normal rate and regular rhythm.     Pulses: Normal pulses.     Heart sounds: Normal heart sounds. No murmur heard.   Pulmonary:     Breath sounds: Normal breath sounds. No wheezing or rales.  Abdominal:     Palpations: Abdomen is soft.     Tenderness: There is no abdominal tenderness.  Musculoskeletal:     Cervical back: Neck supple. No tenderness.     Right lower leg: No edema.     Left lower leg: No edema.  Skin:    General: Skin is warm.     Findings: No rash.  Neurological:     General: No focal deficit present.     Mental Status: She is alert and oriented to person, place, and time.  Psychiatric:        Mood and Affect: Mood normal.        Behavior: Behavior normal.     BP 133/74   Pulse 66   Temp 97.7 F (36.5 C)   Resp 20   Ht $R'5\' 10"'Ht$  (1.778 m)   Wt 184 lb (83.5 kg)   SpO2 98%   BMI 26.40 kg/m  Wt Readings from Last 3 Encounters:  05/25/21 184 lb (83.5 kg)  04/10/21 187 lb 1.9 oz (84.9 kg)  02/09/21 191 lb 12.8 oz (87 kg)     Health Maintenance Due  Topic Date Due  . TETANUS/TDAP  Never done  . PAP SMEAR-Modifier  Never done  . COLONOSCOPY (Pts 45-47yrs Insurance coverage will need to be confirmed)  Never done    There are no preventive care reminders to display for this patient.  No results found for: TSH Lab Results  Component Value Date   WBC 9.1 04/10/2021   HGB 14.4 04/10/2021   HCT 41.8 04/10/2021   MCV 91 04/10/2021   PLT 290 04/10/2021   Lab Results  Component Value Date   NA 141 04/10/2021   K 4.7 04/10/2021   CO2 20 04/10/2021   GLUCOSE 84 04/10/2021   BUN 11  04/10/2021   CREATININE 0.69 04/10/2021   BILITOT 0.5 02/09/2021   ALKPHOS 70 02/09/2021   AST 29 02/09/2021   ALT 24 02/09/2021   PROT 8.0 02/09/2021   ALBUMIN 4.5 02/09/2021   CALCIUM 10.3 (H) 04/10/2021   ANIONGAP 9 02/09/2021   EGFR 102 04/10/2021   Lab Results  Component Value Date   CHOL 260 (H) 04/10/2021   Lab Results  Component  Value Date   HDL 38 (L) 04/10/2021   Lab Results  Component Value Date   LDLCALC 119 (H) 04/10/2021   Lab Results  Component Value Date   TRIG 580 (HH) 04/10/2021   Lab Results  Component Value Date   CHOLHDL 6.8 (H) 04/10/2021   No results found for: HGBA1C    Assessment & Plan:   Problem List Items Addressed This Visit      Cardiovascular and Mediastinum   HTN (hypertension) - Primary (Chronic)    BP Readings from Last 1 Encounters:  05/25/21 133/74   Well-controlled with Atenolol Counseled for compliance with the medications Advised DASH diet and moderate exercise/walking, at least 150 mins/week         Other   H/O rotator cuff tear    Nontraumatic Tylenol/Ibuprofen PRN Gabapentin can also help with neuropathic pain      Relevant Medications   gabapentin (NEURONTIN) 100 MG capsule   Chronic neck pain    Reports h/o DDD of cervical spine Tylenol/Ibuprofen PRN Increased Gabapentin to 200 mg qHS      Relevant Medications   gabapentin (NEURONTIN) 100 MG capsule   Environmental allergies    Well-controlled with Singulair now      Mixed hyperlipidemia    Lipid profile reviewed Started Crestor Repeat lipid profile before next visit      Relevant Orders   CMP14+EGFR   Lipid panel      Meds ordered this encounter  Medications  . gabapentin (NEURONTIN) 100 MG capsule    Sig: Take 2 capsules (200 mg total) by mouth at bedtime.    Dispense:  60 capsule    Refill:  5    Follow-up: Return in about 4 months (around 09/25/2021) for HLD and HTN.    Lindell Spar, MD

## 2021-05-28 LAB — FECAL OCCULT BLOOD, IMMUNOCHEMICAL: IFOBT: NEGATIVE

## 2021-06-19 ENCOUNTER — Other Ambulatory Visit: Payer: Self-pay | Admitting: Internal Medicine

## 2021-06-19 DIAGNOSIS — I10 Essential (primary) hypertension: Secondary | ICD-10-CM

## 2021-06-22 LAB — FECAL OCCULT BLOOD, IMMUNOCHEMICAL: IFOBT: NEGATIVE

## 2021-07-31 ENCOUNTER — Other Ambulatory Visit: Payer: Self-pay | Admitting: Internal Medicine

## 2021-07-31 DIAGNOSIS — I10 Essential (primary) hypertension: Secondary | ICD-10-CM

## 2021-09-07 IMAGING — DX DG CHEST 2V
2 series · 2 of 2 positions shown · non-contrast
Comparison: PA and lateral chest 09/24/2018.  CT chest 08/26/2014.

CLINICAL DATA: Right shoulder and upper chest pain which began
yesterday.

EXAM:
CHEST - 2 VIEW

[w chest pa]
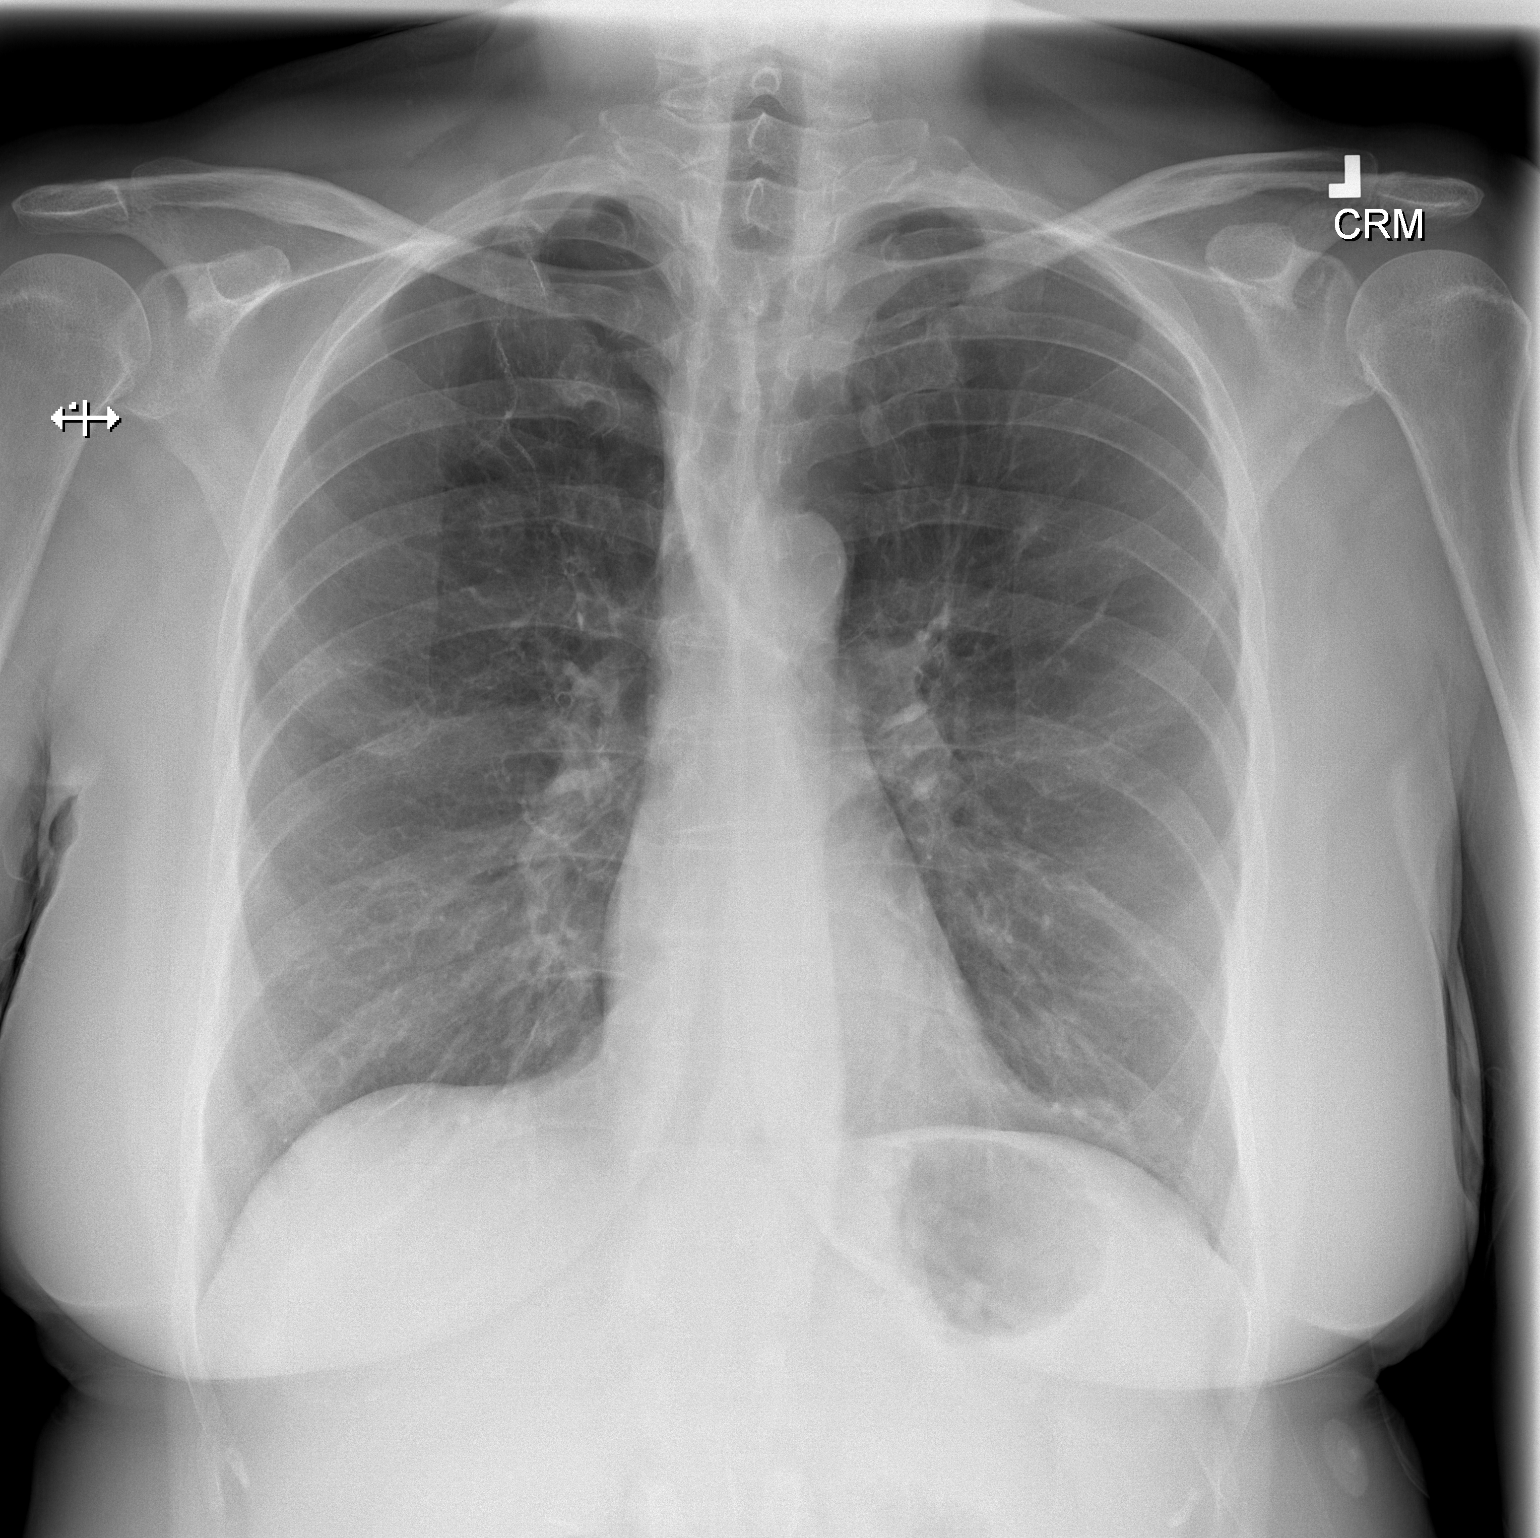

[w chest lat]
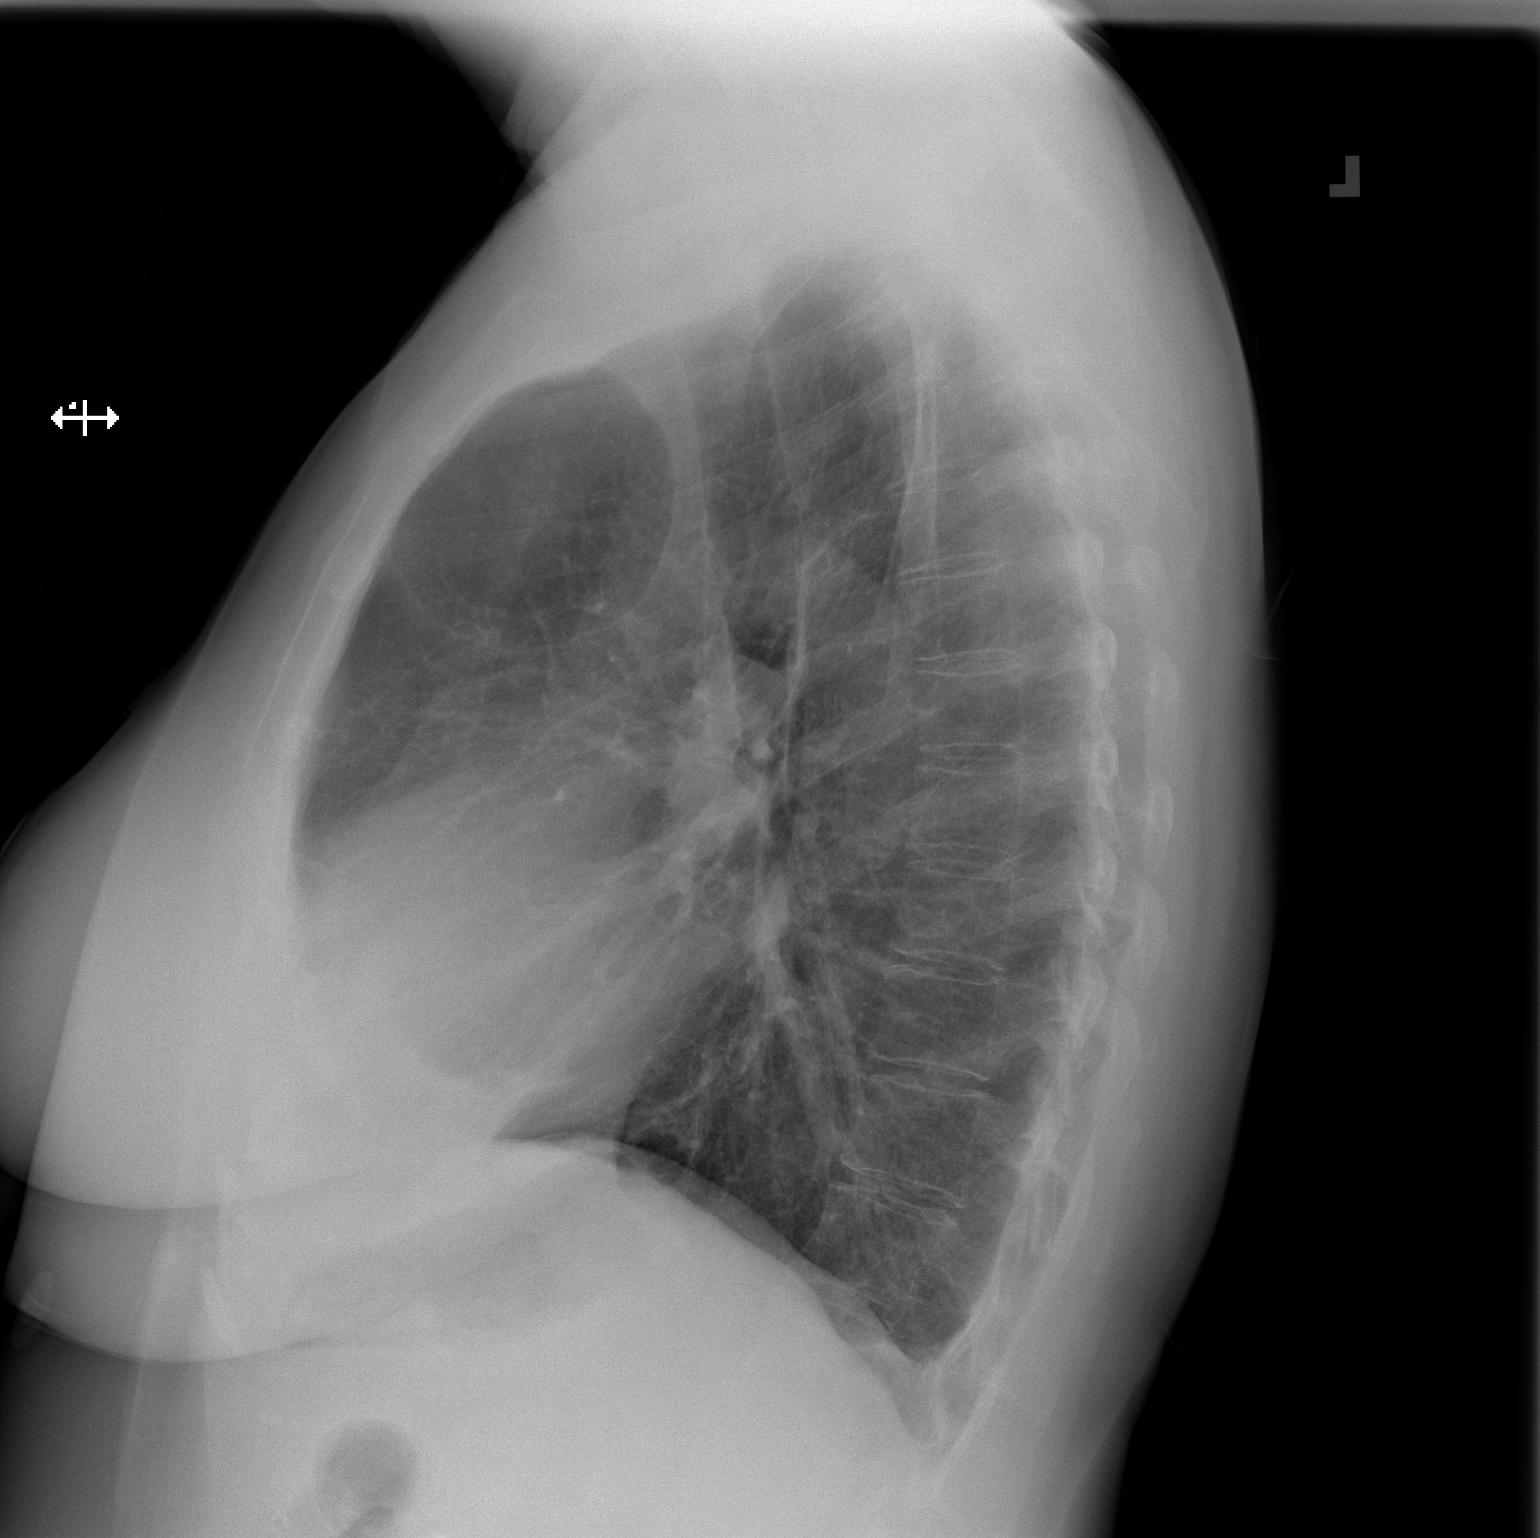

[2 of 2 positions shown; findings below may reference images not displayed]

FINDINGS: The lungs are emphysematous but clear. Heart size is normal. No
pneumothorax or pleural fluid. No acute or focal bony abnormality.
IMPRESSION: No acute disease.

Emphysema ([id number]).

## 2021-09-19 ENCOUNTER — Other Ambulatory Visit: Payer: Self-pay | Admitting: Internal Medicine

## 2021-09-19 DIAGNOSIS — Z9109 Other allergy status, other than to drugs and biological substances: Secondary | ICD-10-CM

## 2021-09-28 ENCOUNTER — Ambulatory Visit: Payer: Self-pay | Admitting: Internal Medicine

## 2021-09-29 LAB — LIPID PANEL
Chol/HDL Ratio: 4.4 ratio (ref 0.0–4.4)
Cholesterol, Total: 173 mg/dL (ref 100–199)
HDL: 39 mg/dL — ABNORMAL LOW (ref >39)
LDL Chol Calc (NIH): 93 mg/dL (ref 0–99)
Triglycerides: 242 mg/dL — ABNORMAL HIGH (ref 0–149)
VLDL Cholesterol Cal: 41 mg/dL — ABNORMAL HIGH (ref 5–40)

## 2021-09-29 LAB — CMP14+EGFR
ALT: 18 IU/L (ref 0–32)
AST: 26 IU/L (ref 0–40)
Albumin/Globulin Ratio: 2.3 — ABNORMAL HIGH (ref 1.2–2.2)
Albumin: 5 g/dL — ABNORMAL HIGH (ref 3.8–4.9)
Alkaline Phosphatase: 86 IU/L (ref 44–121)
BUN/Creatinine Ratio: 20 (ref 9–23)
BUN: 14 mg/dL (ref 6–24)
Bilirubin Total: 0.5 mg/dL (ref 0.0–1.2)
CO2: 22 mmol/L (ref 20–29)
Calcium: 9.9 mg/dL (ref 8.7–10.2)
Chloride: 102 mmol/L (ref 96–106)
Creatinine, Ser: 0.7 mg/dL (ref 0.57–1.00)
Globulin, Total: 2.2 g/dL (ref 1.5–4.5)
Glucose: 74 mg/dL (ref 70–99)
Potassium: 4.8 mmol/L (ref 3.5–5.2)
Sodium: 142 mmol/L (ref 134–144)
Total Protein: 7.2 g/dL (ref 6.0–8.5)
eGFR: 101 mL/min/{1.73_m2} (ref >59)

## 2021-10-03 ENCOUNTER — Encounter: Payer: Self-pay | Admitting: *Deleted

## 2021-10-03 ENCOUNTER — Encounter: Payer: Self-pay | Admitting: Internal Medicine

## 2021-10-03 ENCOUNTER — Ambulatory Visit (INDEPENDENT_AMBULATORY_CARE_PROVIDER_SITE_OTHER): Payer: Self-pay | Admitting: Internal Medicine

## 2021-10-03 ENCOUNTER — Other Ambulatory Visit: Payer: Self-pay

## 2021-10-03 VITALS — BP 113/70 | HR 71 | Temp 98.3°F | Resp 18 | Ht 70.0 in | Wt 174.1 lb

## 2021-10-03 DIAGNOSIS — I10 Essential (primary) hypertension: Secondary | ICD-10-CM

## 2021-10-03 DIAGNOSIS — J449 Chronic obstructive pulmonary disease, unspecified: Secondary | ICD-10-CM

## 2021-10-03 DIAGNOSIS — E782 Mixed hyperlipidemia: Secondary | ICD-10-CM

## 2021-10-03 DIAGNOSIS — K5792 Diverticulitis of intestine, part unspecified, without perforation or abscess without bleeding: Secondary | ICD-10-CM

## 2021-10-03 DIAGNOSIS — G8929 Other chronic pain: Secondary | ICD-10-CM

## 2021-10-03 DIAGNOSIS — M542 Cervicalgia: Secondary | ICD-10-CM

## 2021-10-03 MED ORDER — AMOXICILLIN-POT CLAVULANATE 875-125 MG PO TABS: 1.0000 | ORAL_TABLET | Freq: Two times a day (BID) | ORAL | 0 refills | Status: DC

## 2021-10-03 MED ORDER — ROSUVASTATIN CALCIUM 10 MG PO TABS: 10.0000 mg | ORAL_TABLET | Freq: Every day | ORAL | 1 refills | Status: DC

## 2021-10-03 NOTE — Progress Notes (Signed)
Established Patient Office Visit  Subjective:  Patient ID: Gabriella Vance, female    DOB: 1966-06-17  Age: 55 y.o. MRN: [id number]  CC:  Chief Complaint  Patient presents with   Follow-up    4 month follow up pt is having diverticulitis flare up this started 10-02-21    HPI Gabriella Vance is a 55 year old female with PMH of HTN, COPD, environmental allergies, DDD of cervical spine and rotator cuff tear who presents for  follow up of her chronic medical conditions.   HTN: BP is well-controlled now. Takes medications regularly. Patient denies headache, dizziness, chest pain, dyspnea or palpitations.   Neck pain: She felt better with Gabapentin, but states that she still has mild neck pain at times. Denies any dizziness while taking Gabapentin.  COPD: Well-controlled with Albuterol PRN and Singulair. Denies any dyspnea or wheezing.  She has been taking Crestor for HLD.  Her blood tests show improvement in cholesterol profile.  She complains of LLQ abdominal pain for last 2 days with cramping.  She has a history of diverticulitis in the past.  She denies any melena or hematochezia currently.  Denies any fever, chills, nausea or vomiting.  She had colonoscopy this year.   Past Medical History:  Diagnosis Date   Arthritis    COPD (chronic obstructive pulmonary disease) ([place])    Hypertension     Past Surgical History:  Procedure Laterality Date   CHOLECYSTECTOMY     HIATAL HERNIA REPAIR     LUNG SURGERY     TENDON REPAIR      History reviewed. No pertinent family history.  Social History   Socioeconomic History   Marital status: Married    Spouse name: Not on file   Number of children: Not on file   Years of education: Not on file   Highest education level: Not on file  Occupational History   Not on file  Tobacco Use   Smoking status: Former   Smokeless tobacco: Never  Substance and Sexual Activity   Alcohol use: No   Drug use: No   Sexual activity: Not on  file  Other Topics Concern   Not on file  Social History Narrative   Not on file   Social Determinants of Health   Financial Resource Strain: Not on file  Food Insecurity: Not on file  Transportation Needs: Not on file  Physical Activity: Not on file  Stress: Not on file  Social Connections: Not on file  Intimate Partner Violence: Not on file    Outpatient Medications Prior to Visit  Medication Sig Dispense Refill   albuterol (VENTOLIN HFA) 108 (90 Base) MCG/ACT inhaler Inhale 2 puffs into the lungs every 6 (six) hours as needed for wheezing or shortness of breath. 1 each 0   atenolol (TENORMIN) 50 MG tablet Take 1 tablet by mouth once daily 30 tablet 3   fexofenadine (ALLEGRA) 180 MG tablet Take 180 mg by mouth daily.     gabapentin (NEURONTIN) 100 MG capsule Take 2 capsules (200 mg total) by mouth at bedtime. 60 capsule 5   ibuprofen (ADVIL,MOTRIN) 200 MG tablet Take 400 mg by mouth every 6 (six) hours as needed for mild pain.      montelukast (SINGULAIR) 10 MG tablet TAKE 1 TABLET BY MOUTH AT BEDTIME 30 tablet 5   Multiple Vitamin (MULTIVITAMIN WITH MINERALS) TABS Take 1 tablet by mouth daily.     rosuvastatin (CRESTOR) 10 MG tablet Take 1 tablet (10 mg total)  by mouth daily. 90 tablet 1   No facility-administered medications prior to visit.    Allergies  Allergen Reactions   Latex Anaphylaxis, Shortness Of Breath and Rash    ROS Review of Systems  Constitutional:  Negative for chills and fever.  HENT:  Negative for congestion, sinus pressure, sinus pain and sore throat.   Eyes:  Negative for pain and discharge.  Respiratory:  Negative for cough and shortness of breath.   Cardiovascular:  Negative for chest pain and palpitations.  Gastrointestinal:  Positive for abdominal pain. Negative for diarrhea, nausea and vomiting.  Endocrine: Negative for polydipsia and polyuria.  Genitourinary:  Negative for dysuria and hematuria.  Musculoskeletal:  Positive for arthralgias.  Negative for neck pain and neck stiffness.  Skin:  Negative for rash.  Allergic/Immunologic: Positive for environmental allergies.  Neurological:  Negative for dizziness and weakness.  Psychiatric/Behavioral:  Negative for agitation and behavioral problems.      Objective:    Physical Exam Vitals reviewed.  Constitutional:      General: She is not in acute distress.    Appearance: She is not diaphoretic.  HENT:     Head: Normocephalic and atraumatic.     Nose: Nose normal.     Mouth/Throat:     Mouth: Mucous membranes are moist.  Eyes:     General: No scleral icterus.    Extraocular Movements: Extraocular movements intact.  Cardiovascular:     Rate and Rhythm: Normal rate and regular rhythm.     Pulses: Normal pulses.     Heart sounds: Normal heart sounds. No murmur heard. Pulmonary:     Breath sounds: Normal breath sounds. No wheezing or rales.  Abdominal:     Palpations: Abdomen is soft.     Tenderness: There is abdominal tenderness (LLQ). There is no guarding or rebound.  Musculoskeletal:     Cervical back: Neck supple. No tenderness.     Right lower leg: No edema.     Left lower leg: No edema.  Skin:    General: Skin is warm.     Findings: No rash.  Neurological:     General: No focal deficit present.     Mental Status: She is alert and oriented to person, place, and time.  Psychiatric:        Mood and Affect: Mood normal.        Behavior: Behavior normal.    BP 113/70 (BP Location: Left Arm, Patient Position: Sitting, Cuff Size: Normal)   Pulse 71   Temp 98.3 F (36.8 C) (Oral)   Resp 18   Ht 5' 10"  (1.778 m)   Wt 174 lb 1.9 oz (79 kg)   SpO2 97%   BMI 24.98 kg/m  Wt Readings from Last 3 Encounters:  10/03/21 174 lb 1.9 oz (79 kg)  05/25/21 184 lb (83.5 kg)  04/10/21 187 lb 1.9 oz (84.9 kg)     Health Maintenance Due  Topic Date Due   TETANUS/TDAP  Never done   PAP SMEAR-Modifier  Never done   COLONOSCOPY (Pts 45-80yr Insurance coverage will  need to be confirmed)  Never done   Zoster Vaccines- Shingrix (1 of 2) Never done   COVID-19 Vaccine (4 - Booster for Moderna series) 03/19/2021   INFLUENZA VACCINE  07/31/2021    There are no preventive care reminders to display for this patient.  No results found for: TSH Lab Results  Component Value Date   WBC 9.1 04/10/2021   HGB 14.4 04/10/2021  HCT 41.8 04/10/2021   MCV 91 04/10/2021   PLT 290 04/10/2021   Lab Results  Component Value Date   NA 142 09/28/2021   K 4.8 09/28/2021   CO2 22 09/28/2021   GLUCOSE 74 09/28/2021   BUN 14 09/28/2021   CREATININE 0.70 09/28/2021   BILITOT 0.5 09/28/2021   ALKPHOS 86 09/28/2021   AST 26 09/28/2021   ALT 18 09/28/2021   PROT 7.2 09/28/2021   ALBUMIN 5.0 (H) 09/28/2021   CALCIUM 9.9 09/28/2021   ANIONGAP 9 02/09/2021   EGFR 101 09/28/2021   Lab Results  Component Value Date   CHOL 173 09/28/2021   Lab Results  Component Value Date   HDL 39 (L) 09/28/2021   Lab Results  Component Value Date   LDLCALC 93 09/28/2021   Lab Results  Component Value Date   TRIG 242 (H) 09/28/2021   Lab Results  Component Value Date   CHOLHDL 4.4 09/28/2021   No results found for: HGBA1C    Assessment & Plan:   Problem List Items Addressed This Visit       Cardiovascular and Mediastinum   HTN (hypertension) - Primary (Chronic)    BP Readings from Last 1 Encounters:  10/03/21 113/70  Well-controlled with Atenolol Counseled for compliance with the medications Advised DASH diet and moderate exercise/walking, at least 150 mins/week       Relevant Medications   rosuvastatin (CRESTOR) 10 MG tablet   Other Relevant Orders   CBC with Differential/Platelet   CMP14+EGFR   TSH     Respiratory   COPD (chronic obstructive pulmonary disease) ([facility])    Well-controlled with Albuterol PRN On Singulair        Other   Chronic neck pain    Reports h/o DDD of cervical spine Tylenol/Ibuprofen PRN On Gabapentin to 200 mg qHS       Mixed hyperlipidemia    Lipid profile reviewed Better with Crestor Repeat lipid profile before next visit      Relevant Medications   rosuvastatin (CRESTOR) 10 MG tablet   Other Relevant Orders   Lipid panel   TSH   Other Visit Diagnoses     Diverticulitis     Symptoms consistent with acute diverticulitis Reports having a cheese sandwich 2 days ago, after which her symptoms started Started Augmentin Soft diet for now, advance as tolerated   Relevant Medications   amoxicillin-clavulanate (AUGMENTIN) 875-125 MG tablet   Other Relevant Orders   CBC with Differential/Platelet       Meds ordered this encounter  Medications   amoxicillin-clavulanate (AUGMENTIN) 875-125 MG tablet    Sig: Take 1 tablet by mouth 2 (two) times daily.    Dispense:  14 tablet    Refill:  0   rosuvastatin (CRESTOR) 10 MG tablet    Sig: Take 1 tablet (10 mg total) by mouth daily.    Dispense:  90 tablet    Refill:  1    Follow-up: Return in about 6 months (around 04/03/2022) for HTN and HLD.    Lindell Spar, MD

## 2021-10-03 NOTE — Assessment & Plan Note (Signed)
Lipid profile reviewed Better with Crestor Repeat lipid profile before next visit

## 2021-10-03 NOTE — Patient Instructions (Addendum)
Please start taking Augmentin as prescribed.  Please continue taking other medications as prescribed.

## 2021-10-03 NOTE — Assessment & Plan Note (Signed)
Reports h/o DDD of cervical spine Tylenol/Ibuprofen PRN On Gabapentin to 200 mg qHS 

## 2021-10-03 NOTE — Assessment & Plan Note (Signed)
BP Readings from Last 1 Encounters:  10/03/21 113/70   Well-controlled with Atenolol Counseled for compliance with the medications Advised DASH diet and moderate exercise/walking, at least 150 mins/week

## 2021-10-03 NOTE — Assessment & Plan Note (Signed)
Well-controlled with Albuterol PRN On Singulair

## 2021-10-18 ENCOUNTER — Telehealth: Payer: Self-pay | Admitting: Internal Medicine

## 2021-10-18 ENCOUNTER — Other Ambulatory Visit: Payer: Self-pay | Admitting: *Deleted

## 2021-10-18 DIAGNOSIS — E782 Mixed hyperlipidemia: Secondary | ICD-10-CM

## 2021-10-18 MED ORDER — ROSUVASTATIN CALCIUM 10 MG PO TABS: 10.0000 mg | ORAL_TABLET | Freq: Every day | ORAL | 1 refills | Status: DC

## 2021-10-18 NOTE — Telephone Encounter (Signed)
Pt medication sent to pharmacy  

## 2021-10-18 NOTE — Telephone Encounter (Signed)
Pt called in for refills on cholesterol  meds rosuvastin  also wants them sent back to [facility] stead of [facility]. Pt states [facility] cost is Higher  And pt also want prescriptions in generic versions

## 2022-02-10 IMAGING — DX DG CHEST 1V PORT
1 series · 1 of 1 positions shown · non-contrast
Comparison: September 06, 2020

CLINICAL DATA: Shortness of breath

EXAM:
PORTABLE CHEST 1 VIEW

[chest ap]
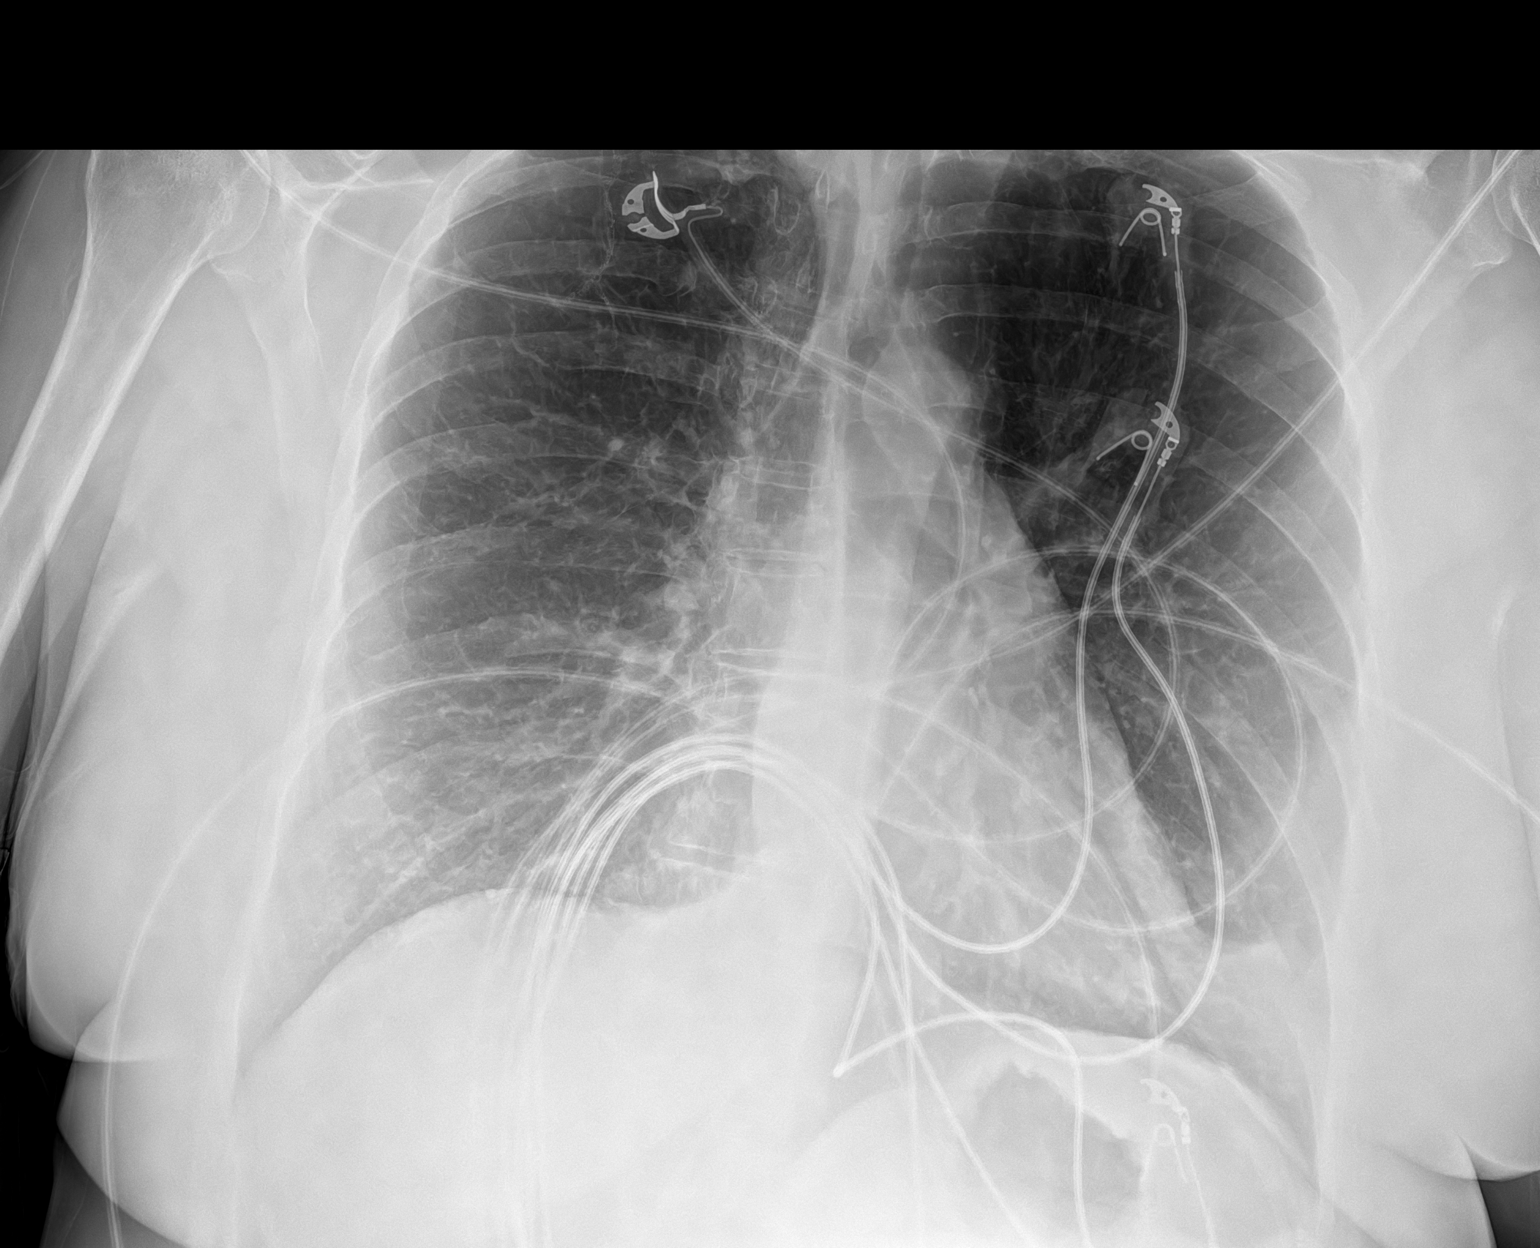

[1 of 1 positions shown; findings below may reference images not displayed]

FINDINGS: Postoperative change noted in the right apex region. Relative
lucency in the upper lobes is stable and likely due to underlying
bullous disease. There is no appreciable edema or airspace opacity.
There are areas of mild interstitial thickening. Heart size is
normal. Stable pulmonary vascularity with somewhat diminished
vascularity in the upper lobe regions, presumably due to upper lobe
bullous disease. No adenopathy. No bone lesions.
IMPRESSION: Underlying upper lobe bullous disease. Postoperative change right
apex. Interstitial thickening may represent a degree of underlying
chronic bronchitis. No edema or airspace opacity. Stable cardiac
silhouette.

## 2022-02-27 ENCOUNTER — Other Ambulatory Visit: Payer: Self-pay | Admitting: Internal Medicine

## 2022-02-27 DIAGNOSIS — I10 Essential (primary) hypertension: Secondary | ICD-10-CM

## 2022-02-28 ENCOUNTER — Other Ambulatory Visit: Payer: Self-pay | Admitting: *Deleted

## 2022-02-28 DIAGNOSIS — E782 Mixed hyperlipidemia: Secondary | ICD-10-CM

## 2022-02-28 MED ORDER — ROSUVASTATIN CALCIUM 10 MG PO TABS
10.0000 mg | ORAL_TABLET | Freq: Every day | ORAL | 1 refills | Status: DC
Start: 2022-02-28 — End: 2023-05-28

## 2022-03-08 ENCOUNTER — Other Ambulatory Visit: Payer: Self-pay | Admitting: Internal Medicine

## 2022-03-08 ENCOUNTER — Other Ambulatory Visit: Payer: Self-pay | Admitting: *Deleted

## 2022-03-08 DIAGNOSIS — Z8739 Personal history of other diseases of the musculoskeletal system and connective tissue: Secondary | ICD-10-CM

## 2022-03-08 DIAGNOSIS — Z9109 Other allergy status, other than to drugs and biological substances: Secondary | ICD-10-CM

## 2022-03-08 DIAGNOSIS — I10 Essential (primary) hypertension: Secondary | ICD-10-CM

## 2022-03-08 MED ORDER — MONTELUKAST SODIUM 10 MG PO TABS: 10.0000 mg | ORAL_TABLET | Freq: Every day | ORAL | 5 refills | Status: DC

## 2022-03-08 MED ORDER — GABAPENTIN 100 MG PO CAPS: 200.0000 mg | ORAL_CAPSULE | Freq: Every day | ORAL | 5 refills | Status: DC

## 2022-03-08 MED ORDER — ATENOLOL 50 MG PO TABS: 50.0000 mg | ORAL_TABLET | Freq: Every day | ORAL | 0 refills | Status: DC

## 2022-03-31 LAB — LIPID PANEL
Chol/HDL Ratio: 4.3 ratio (ref 0.0–4.4)
Cholesterol, Total: 182 mg/dL (ref 100–199)
HDL: 42 mg/dL (ref >39)
LDL Chol Calc (NIH): 87 mg/dL (ref 0–99)
Triglycerides: 320 mg/dL — ABNORMAL HIGH (ref 0–149)
VLDL Cholesterol Cal: 53 mg/dL — ABNORMAL HIGH (ref 5–40)

## 2022-03-31 LAB — CMP14+EGFR
ALT: 17 IU/L (ref 0–32)
AST: 23 IU/L (ref 0–40)
Albumin/Globulin Ratio: 2.1 (ref 1.2–2.2)
Albumin: 4.5 g/dL (ref 3.8–4.9)
Alkaline Phosphatase: 84 IU/L (ref 44–121)
BUN/Creatinine Ratio: 20 (ref 9–23)
BUN: 14 mg/dL (ref 6–24)
Bilirubin Total: 0.3 mg/dL (ref 0.0–1.2)
CO2: 25 mmol/L (ref 20–29)
Calcium: 9.7 mg/dL (ref 8.7–10.2)
Chloride: 101 mmol/L (ref 96–106)
Creatinine, Ser: 0.7 mg/dL (ref 0.57–1.00)
Globulin, Total: 2.1 g/dL (ref 1.5–4.5)
Glucose: 81 mg/dL (ref 70–99)
Potassium: 4.6 mmol/L (ref 3.5–5.2)
Sodium: 139 mmol/L (ref 134–144)
Total Protein: 6.6 g/dL (ref 6.0–8.5)
eGFR: 101 mL/min/{1.73_m2} (ref >59)

## 2022-03-31 LAB — CBC WITH DIFFERENTIAL/PLATELET
Basophils Absolute: 0.1 10*3/uL (ref 0.0–0.2)
Basos: 1 %
EOS (ABSOLUTE): 0.5 10*3/uL — ABNORMAL HIGH (ref 0.0–0.4)
Eos: 6 %
Hematocrit: 42.7 % (ref 34.0–46.6)
Hemoglobin: 14.5 g/dL (ref 11.1–15.9)
Immature Grans (Abs): 0 10*3/uL (ref 0.0–0.1)
Immature Granulocytes: 0 %
Lymphocytes Absolute: 2.1 10*3/uL (ref 0.7–3.1)
Lymphs: 27 %
MCH: 31.9 pg (ref 26.6–33.0)
MCHC: 34 g/dL (ref 31.5–35.7)
MCV: 94 fL (ref 79–97)
Monocytes Absolute: 0.6 10*3/uL (ref 0.1–0.9)
Monocytes: 8 %
Neutrophils Absolute: 4.4 10*3/uL (ref 1.4–7.0)
Neutrophils: 58 %
Platelets: 250 10*3/uL (ref 150–450)
RBC: 4.55 x10E6/uL (ref 3.77–5.28)
RDW: 11.9 % (ref 11.7–15.4)
WBC: 7.7 10*3/uL (ref 3.4–10.8)

## 2022-03-31 LAB — TSH: TSH: 0.887 u[IU]/mL (ref 0.450–4.500)

## 2022-04-03 ENCOUNTER — Ambulatory Visit (INDEPENDENT_AMBULATORY_CARE_PROVIDER_SITE_OTHER): Payer: 59 | Admitting: Internal Medicine

## 2022-04-03 ENCOUNTER — Encounter: Payer: Self-pay | Admitting: Internal Medicine

## 2022-04-03 ENCOUNTER — Encounter: Payer: Self-pay | Admitting: *Deleted

## 2022-04-03 VITALS — BP 118/82 | HR 55 | Resp 16 | Ht 70.0 in | Wt 170.0 lb

## 2022-04-03 DIAGNOSIS — I10 Essential (primary) hypertension: Secondary | ICD-10-CM

## 2022-04-03 DIAGNOSIS — G8929 Other chronic pain: Secondary | ICD-10-CM

## 2022-04-03 DIAGNOSIS — E782 Mixed hyperlipidemia: Secondary | ICD-10-CM

## 2022-04-03 DIAGNOSIS — M542 Cervicalgia: Secondary | ICD-10-CM

## 2022-04-03 DIAGNOSIS — J441 Chronic obstructive pulmonary disease with (acute) exacerbation: Secondary | ICD-10-CM | POA: Diagnosis not present

## 2022-04-03 MED ORDER — ALBUTEROL SULFATE HFA 108 (90 BASE) MCG/ACT IN AERS: 1.0000 | INHALATION_SPRAY | Freq: Four times a day (QID) | RESPIRATORY_TRACT | 5 refills | Status: DC | PRN

## 2022-04-03 MED ORDER — AZITHROMYCIN 250 MG PO TABS: ORAL_TABLET | ORAL | 0 refills | Status: AC

## 2022-04-03 MED ORDER — METHYLPREDNISOLONE 4 MG PO TBPK: ORAL_TABLET | ORAL | 0 refills | Status: DC

## 2022-04-03 NOTE — Assessment & Plan Note (Signed)
BP Readings from Last 1 Encounters:  ?04/03/22 118/82  ? ?Well-controlled with Atenolol ?Counseled for compliance with the medications ?Advised DASH diet and moderate exercise/walking, at least 150 mins/week ? ?
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
## 2022-04-03 NOTE — Assessment & Plan Note (Addendum)
With acute exacerbation ?Started empiric azithromycin for coverage for sinusitis as well ?Medrol Dosepak ?Mucinex as needed for cough ?Albuterol PRN ?On Singulair ?

## 2022-04-03 NOTE — Assessment & Plan Note (Signed)
Reports h/o DDD of cervical spine Tylenol/Ibuprofen PRN On Gabapentin to 200 mg qHS 

## 2022-04-03 NOTE — Patient Instructions (Signed)
Please take Azithromycin and Prednisone as prescribed. Please use Albuterol as needed for shortness of breath or wheezing. ? ?Please start taking Fish oil 2000 mg twice daily. ? ?Please continue taking other medications as prescribed. ?

## 2022-04-03 NOTE — Assessment & Plan Note (Signed)
Lipid profile reviewed Better with Crestor Advised to take fish oil for elevated TGs 

## 2022-07-05 ENCOUNTER — Other Ambulatory Visit: Payer: Self-pay | Admitting: *Deleted

## 2022-07-05 DIAGNOSIS — Z1211 Encounter for screening for malignant neoplasm of colon: Secondary | ICD-10-CM

## 2022-07-12 ENCOUNTER — Other Ambulatory Visit: Payer: Self-pay | Admitting: Internal Medicine

## 2022-07-12 DIAGNOSIS — I10 Essential (primary) hypertension: Secondary | ICD-10-CM

## 2022-08-23 ENCOUNTER — Other Ambulatory Visit: Payer: Self-pay | Admitting: Internal Medicine

## 2022-08-23 DIAGNOSIS — I10 Essential (primary) hypertension: Secondary | ICD-10-CM

## 2022-10-08 ENCOUNTER — Encounter: Payer: 59 | Admitting: Internal Medicine

## 2022-12-07 ENCOUNTER — Other Ambulatory Visit: Payer: Self-pay | Admitting: Internal Medicine

## 2022-12-07 DIAGNOSIS — I10 Essential (primary) hypertension: Secondary | ICD-10-CM

## 2022-12-07 DIAGNOSIS — Z8739 Personal history of other diseases of the musculoskeletal system and connective tissue: Secondary | ICD-10-CM

## 2022-12-12 ENCOUNTER — Ambulatory Visit: Payer: 59 | Admitting: Internal Medicine

## 2022-12-12 ENCOUNTER — Telehealth: Payer: Self-pay | Admitting: Internal Medicine

## 2022-12-12 ENCOUNTER — Encounter: Payer: Self-pay | Admitting: Internal Medicine

## 2022-12-12 VITALS — Temp 102.8°F

## 2022-12-12 DIAGNOSIS — U071 COVID-19: Secondary | ICD-10-CM | POA: Diagnosis not present

## 2022-12-12 MED ORDER — NIRMATRELVIR/RITONAVIR (PAXLOVID)TABLET: 3.0000 | ORAL_TABLET | Freq: Two times a day (BID) | ORAL | 0 refills | Status: AC

## 2022-12-12 NOTE — Progress Notes (Signed)
Virtual Visit via Telephone Note   This visit type was conducted via telephone. This format is felt to be most appropriate for this patient at this time.  The patient did not have access to video technology/had technical difficulties with video requiring transitioning to audio format only (telephone).  All issues noted in this document were discussed and addressed.  No physical exam could be performed with this format.  Evaluation Performed:  Follow-up visit  Date:  12/12/2022   ID:  Gabriella Vance, DOB 02-23-1966, MRN [id number]  Patient Location: Home Provider Location: Office/Clinic  Participants: Patient Location of Patient: Home Location of Provider: Telehealth Consent was obtain for visit to be over via telehealth. I verified that I am speaking with the correct person using two identifiers.  PCP:  Anabel Halon, MD   Chief Complaint: Fever, cough and sore throat  History of Present Illness:    Gabriella Vance is a 56 y.o. female has a daily visit for complaint of fever, cough and sore throat since yesterday.  She had positive COVID test X 3 at home today.  She denies any dyspnea or wheezing currently.  She has history of COPD.  She is up to date with COVID vaccines.  The patient does have symptoms concerning for COVID-19 infection (fever, chills, cough, or new shortness of breath).   Past Medical, Surgical, Social History, Allergies, and Medications have been Reviewed.  Past Medical History:  Diagnosis Date   Arthritis    COPD (chronic obstructive pulmonary disease) ([facility])    Hypertension    Past Surgical History:  Procedure Laterality Date   CHOLECYSTECTOMY     HIATAL HERNIA REPAIR     LUNG SURGERY     TENDON REPAIR       Current Meds  Medication Sig   nirmatrelvir/ritonavir EUA (PAXLOVID) 20 x 150 MG & 10 x 100MG  TABS Take 3 tablets by mouth 2 (two) times daily for 5 days. (Take nirmatrelvir 150 mg two tablets twice daily for 5 days and ritonavir 100 mg  one tablet twice daily for 5 days) Patient GFR is >60.     Allergies:   Latex   ROS:   Please see the history of present illness.     All other systems reviewed and are negative.   Labs/Other Tests and Data Reviewed:    Recent Labs: 03/30/2022: ALT 17; BUN 14; Creatinine, Ser 0.70; Hemoglobin 14.5; Platelets 250; Potassium 4.6; Sodium 139; TSH 0.887   Recent Lipid Panel Lab Results  Component Value Date/Time   CHOL 182 03/30/2022 08:18 AM   TRIG 320 (H) 03/30/2022 08:18 AM   HDL 42 03/30/2022 08:18 AM   CHOLHDL 4.3 03/30/2022 08:18 AM   LDLCALC 87 03/30/2022 08:18 AM    Wt Readings from Last 3 Encounters:  04/03/22 170 lb (77.1 kg)  10/03/21 174 lb 1.9 oz (79 kg)  05/25/21 184 lb (83.5 kg)     ASSESSMENT & PLAN:    COVID-19 infection Started Paxlovid Robitussin DM Max as needed for cough Albuterol as needed for dyspnea or wheezing Self quarantine for at least 5 days from symptom onset and or 24-hour afebrile period, whichever is later  Time:   Today, I have spent 9 minutes reviewing the chart, including problem list, medications, and with the patient with telehealth technology discussing the above problems.   Medication Adjustments/Labs and Tests Ordered: Current medicines are reviewed at length with the patient today.  Concerns regarding medicines are outlined above.  Tests Ordered: No orders of the defined types were placed in this encounter.   Medication Changes: Meds ordered this encounter  Medications   nirmatrelvir/ritonavir EUA (PAXLOVID) 20 x 150 MG & 10 x 100MG  TABS    Sig: Take 3 tablets by mouth 2 (two) times daily for 5 days. (Take nirmatrelvir 150 mg two tablets twice daily for 5 days and ritonavir 100 mg one tablet twice daily for 5 days) Patient GFR is >60.    Dispense:  30 tablet    Refill:  0     Note: This dictation was prepared with Dragon dictation along with smaller phrase technology. Similar sounding words can be transcribed  inadequately or may not be corrected upon review. Any transcriptional errors that result from this process are unintentional.      Disposition:  Follow up  Signed, , MD  12/12/2022 3:38 PM     12/14/2022 [facility]

## 2022-12-12 NOTE — Patient Instructions (Addendum)
Please start taking Paxlovid as prescribed.  Please take Robitussin DM max as needed for cough.

## 2022-12-12 NOTE — Telephone Encounter (Signed)
Pt called stating she took 3 home test & +covid. States she is really sick. Dr. Allena Katz is booked. She wants to know what to do?

## 2023-01-10 ENCOUNTER — Ambulatory Visit (INDEPENDENT_AMBULATORY_CARE_PROVIDER_SITE_OTHER): Payer: 59 | Admitting: Internal Medicine

## 2023-01-10 ENCOUNTER — Encounter: Payer: Self-pay | Admitting: Internal Medicine

## 2023-01-10 VITALS — BP 105/66 | HR 76 | Ht 70.0 in | Wt 167.6 lb

## 2023-01-10 DIAGNOSIS — J441 Chronic obstructive pulmonary disease with (acute) exacerbation: Secondary | ICD-10-CM

## 2023-01-10 DIAGNOSIS — R7303 Prediabetes: Secondary | ICD-10-CM

## 2023-01-10 DIAGNOSIS — Z1159 Encounter for screening for other viral diseases: Secondary | ICD-10-CM

## 2023-01-10 DIAGNOSIS — E559 Vitamin D deficiency, unspecified: Secondary | ICD-10-CM

## 2023-01-10 DIAGNOSIS — M5030 Other cervical disc degeneration, unspecified cervical region: Secondary | ICD-10-CM | POA: Diagnosis not present

## 2023-01-10 DIAGNOSIS — Z8739 Personal history of other diseases of the musculoskeletal system and connective tissue: Secondary | ICD-10-CM

## 2023-01-10 DIAGNOSIS — Z9109 Other allergy status, other than to drugs and biological substances: Secondary | ICD-10-CM | POA: Diagnosis not present

## 2023-01-10 DIAGNOSIS — Z0001 Encounter for general adult medical examination with abnormal findings: Secondary | ICD-10-CM | POA: Diagnosis not present

## 2023-01-10 DIAGNOSIS — E782 Mixed hyperlipidemia: Secondary | ICD-10-CM | POA: Diagnosis not present

## 2023-01-10 DIAGNOSIS — J42 Unspecified chronic bronchitis: Secondary | ICD-10-CM

## 2023-01-10 DIAGNOSIS — I10 Essential (primary) hypertension: Secondary | ICD-10-CM | POA: Diagnosis not present

## 2023-01-10 MED ORDER — MONTELUKAST SODIUM 10 MG PO TABS: 10.0000 mg | ORAL_TABLET | Freq: Every day | ORAL | 11 refills | Status: DC

## 2023-01-10 MED ORDER — ATENOLOL 50 MG PO TABS: 50.0000 mg | ORAL_TABLET | Freq: Every day | ORAL | 3 refills | Status: AC

## 2023-01-10 MED ORDER — GABAPENTIN 100 MG PO CAPS: 200.0000 mg | ORAL_CAPSULE | Freq: Every day | ORAL | 3 refills | Status: AC

## 2023-01-10 MED ORDER — ALBUTEROL SULFATE HFA 108 (90 BASE) MCG/ACT IN AERS: 1.0000 | INHALATION_SPRAY | Freq: Four times a day (QID) | RESPIRATORY_TRACT | 3 refills | Status: DC | PRN

## 2023-01-10 MED ORDER — AZELASTINE HCL 0.1 % NA SOLN: 2.0000 | Freq: Two times a day (BID) | NASAL | 5 refills | Status: AC

## 2023-01-10 NOTE — Assessment & Plan Note (Signed)
Mucinex as needed for cough Albuterol PRN On Singulair

## 2023-01-10 NOTE — Assessment & Plan Note (Addendum)
Has persistent allergic rhinitis Added azelastine nasal spray Continue Singulair for now

## 2023-01-10 NOTE — Assessment & Plan Note (Signed)
Physical exam as documented. Fasting blood tests ordered. Prefers to get flu and Shingrix #1 later. Denies PAP smear and colon cancer screening for now. Ordered Mammogram.

## 2023-01-10 NOTE — Assessment & Plan Note (Signed)
Reports h/o DDD of cervical spine Tylenol/Ibuprofen PRN On Gabapentin to 200 mg qHS

## 2023-01-10 NOTE — Assessment & Plan Note (Addendum)
BP Readings from Last 1 Encounters:  01/10/23 105/66   Well-controlled with Atenolol Would prefer ACEi/ARB, but she prefers to continue Atenolol Counseled for compliance with the medications Advised DASH diet and moderate exercise/walking, at least 150 mins/week

## 2023-01-10 NOTE — Patient Instructions (Addendum)
Please continue taking medications as prescribed.  Please continue to follow low salt diet and perform moderate exercise/walking at least 150 mins/week.  Please get fasting blood tests done within a week. 

## 2023-01-10 NOTE — Progress Notes (Signed)
Established Patient Office Visit  Subjective:  Patient ID: Gabriella Vance, female    DOB: 17-Sep-1966  Age: 57 y.o. MRN: [id number]  CC:  Chief Complaint  Patient presents with   Annual Exam    HPI Gabriella Vance is a 57 y.o. female with past medical history of HTN, COPD, environmental allergies, DDD of cervical spine and rotator cuff tear who presents for annual physical.  HTN: BP is well-controlled now. Takes medications regularly. Patient denies headache, dizziness, chest pain, dyspnea or palpitations.   Neck pain: She felt better with Gabapentin, but states that she still has mild neck pain at times. Denies any dizziness while taking Gabapentin.  COPD: She uses her albuterol as needed for dyspnea or wheezing.  She rarely needs to use it.  She has chronic nasal congestion and postnasal drip.  She has multiple environmental allergies.  She takes care of multiple pets.  She has stopped taking Singulair as she thought it was not helping.  She has also tried Claritin, Zyrtec and Clarinex.  She also uses Nasacort for nasal congestion.    Past Medical History:  Diagnosis Date   Arthritis    COPD (chronic obstructive pulmonary disease) ([facility])    Hypertension     Past Surgical History:  Procedure Laterality Date   CHOLECYSTECTOMY     HIATAL HERNIA REPAIR     LUNG SURGERY     TENDON REPAIR      History reviewed. No pertinent family history.  Social History   Socioeconomic History   Marital status: Married    Spouse name: Not on file   Number of children: Not on file   Years of education: Not on file   Highest education level: Not on file  Occupational History   Not on file  Tobacco Use   Smoking status: Former   Smokeless tobacco: Never  Substance and Sexual Activity   Alcohol use: No   Drug use: No   Sexual activity: Not on file  Other Topics Concern   Not on file  Social History Narrative   Not on file   Social Determinants of Health   Financial Resource  Strain: Not on file  Food Insecurity: Not on file  Transportation Needs: Not on file  Physical Activity: Not on file  Stress: Not on file  Social Connections: Not on file  Intimate Partner Violence: Not on file    Outpatient Medications Prior to Visit  Medication Sig Dispense Refill   fexofenadine (ALLEGRA) 180 MG tablet Take 180 mg by mouth daily.     ibuprofen (ADVIL,MOTRIN) 200 MG tablet Take 400 mg by mouth every 6 (six) hours as needed for mild pain.      Multiple Vitamin (MULTIVITAMIN WITH MINERALS) TABS Take 1 tablet by mouth daily.     rosuvastatin (CRESTOR) 10 MG tablet Take 1 tablet (10 mg total) by mouth daily. 90 tablet 1   albuterol (VENTOLIN HFA) 108 (90 Base) MCG/ACT inhaler Inhale 1 puff into the lungs every 6 (six) hours as needed for wheezing or shortness of breath. 8 g 5   atenolol (TENORMIN) 50 MG tablet Take 1 tablet by mouth once daily 90 tablet 0   gabapentin (NEURONTIN) 100 MG capsule TAKE 2 CAPSULES BY MOUTH AT BEDTIME 60 capsule 0   montelukast (SINGULAIR) 10 MG tablet Take 1 tablet (10 mg total) by mouth at bedtime. 30 tablet 5   No facility-administered medications prior to visit.    Allergies  Allergen Reactions  Latex Anaphylaxis, Shortness Of Breath and Rash    ROS Review of Systems  Constitutional:  Negative for chills and fever.  HENT:  Positive for congestion and postnasal drip. Negative for sinus pressure and sore throat.   Eyes:  Negative for pain and discharge.  Respiratory:  Negative for cough, shortness of breath and wheezing.   Cardiovascular:  Negative for chest pain and palpitations.  Gastrointestinal:  Negative for abdominal pain, diarrhea, nausea and vomiting.  Endocrine: Negative for polydipsia and polyuria.  Genitourinary:  Negative for dysuria and hematuria.  Musculoskeletal:  Positive for arthralgias. Negative for neck pain and neck stiffness.  Skin:  Negative for rash.  Allergic/Immunologic: Positive for environmental  allergies.  Neurological:  Negative for dizziness and weakness.  Psychiatric/Behavioral:  Negative for agitation and behavioral problems.       Objective:    Physical Exam Vitals reviewed.  Constitutional:      General: She is not in acute distress.    Appearance: She is not diaphoretic.  HENT:     Head: Normocephalic and atraumatic.     Nose: Congestion present.     Mouth/Throat:     Mouth: Mucous membranes are moist.  Eyes:     General: No scleral icterus.    Extraocular Movements: Extraocular movements intact.  Cardiovascular:     Rate and Rhythm: Normal rate and regular rhythm.     Pulses: Normal pulses.     Heart sounds: Normal heart sounds. No murmur heard. Pulmonary:     Breath sounds: Normal breath sounds. No wheezing or rales.  Abdominal:     Palpations: Abdomen is soft.     Tenderness: There is no abdominal tenderness. There is no guarding or rebound.  Musculoskeletal:     Cervical back: Neck supple. No tenderness.     Right lower leg: No edema.     Left lower leg: No edema.  Skin:    General: Skin is warm.     Findings: No rash.  Neurological:     General: No focal deficit present.     Mental Status: She is alert and oriented to person, place, and time.     Sensory: No sensory deficit.     Motor: No weakness.  Psychiatric:        Mood and Affect: Mood normal.        Behavior: Behavior normal.     BP 105/66 (BP Location: Left Arm, Patient Position: Sitting, Cuff Size: Normal)   Pulse 76   Ht 5\' 10"  (1.778 m)   Wt 167 lb 9.6 oz (76 kg)   SpO2 95%   BMI 24.05 kg/m  Wt Readings from Last 3 Encounters:  01/10/23 167 lb 9.6 oz (76 kg)  04/03/22 170 lb (77.1 kg)  10/03/21 174 lb 1.9 oz (79 kg)    Lab Results  Component Value Date   TSH 0.887 03/30/2022   Lab Results  Component Value Date   WBC 7.7 03/30/2022   HGB 14.5 03/30/2022   HCT 42.7 03/30/2022   MCV 94 03/30/2022   PLT 250 03/30/2022   Lab Results  Component Value Date   NA 139  03/30/2022   K 4.6 03/30/2022   CO2 25 03/30/2022   GLUCOSE 81 03/30/2022   BUN 14 03/30/2022   CREATININE 0.70 03/30/2022   BILITOT 0.3 03/30/2022   ALKPHOS 84 03/30/2022   AST 23 03/30/2022   ALT 17 03/30/2022   PROT 6.6 03/30/2022   ALBUMIN 4.5 03/30/2022   CALCIUM  9.7 03/30/2022   ANIONGAP 9 02/09/2021   EGFR 101 03/30/2022   Lab Results  Component Value Date   CHOL 182 03/30/2022   Lab Results  Component Value Date   HDL 42 03/30/2022   Lab Results  Component Value Date   LDLCALC 87 03/30/2022   Lab Results  Component Value Date   TRIG 320 (H) 03/30/2022   Lab Results  Component Value Date   CHOLHDL 4.3 03/30/2022   No results found for: "HGBA1C"    Assessment & Plan:   Problem List Items Addressed This Visit       Cardiovascular and Mediastinum   HTN (hypertension) (Chronic)    BP Readings from Last 1 Encounters:  01/10/23 105/66  Well-controlled with Atenolol Would prefer ACEi/ARB, but she prefers to continue Atenolol Counseled for compliance with the medications Advised DASH diet and moderate exercise/walking, at least 150 mins/week       Relevant Medications   atenolol (TENORMIN) 50 MG tablet   Other Relevant Orders   TSH   CMP14+EGFR   CBC with Differential/Platelet     Respiratory   COPD (chronic obstructive pulmonary disease) ([facility])    Mucinex as needed for cough Albuterol PRN On Singulair      Relevant Medications   montelukast (SINGULAIR) 10 MG tablet   albuterol (VENTOLIN HFA) 108 (90 Base) MCG/ACT inhaler   azelastine (ASTELIN) 0.1 % nasal spray     Musculoskeletal and Integument   DDD (degenerative disc disease), cervical    Reports h/o DDD of cervical spine Tylenol/Ibuprofen PRN On Gabapentin to 200 mg qHS        Other   H/O rotator cuff tear   Relevant Medications   gabapentin (NEURONTIN) 100 MG capsule   Environmental allergies    Has persistent allergic rhinitis Added azelastine nasal spray Continue Singulair  for now      Relevant Medications   montelukast (SINGULAIR) 10 MG tablet   azelastine (ASTELIN) 0.1 % nasal spray   Mixed hyperlipidemia    Lipid profile reviewed Better with Crestor Advised to take fish oil for elevated TGs      Relevant Medications   atenolol (TENORMIN) 50 MG tablet   Other Relevant Orders   Lipid panel   Encounter for general adult medical examination with abnormal findings - Primary    Physical exam as documented. Fasting blood tests ordered. Prefers to get flu and Shingrix #1 later. Denies PAP smear and colon cancer screening for now. Ordered Mammogram.      Relevant Orders   CMP14+EGFR   CBC with Differential/Platelet   Other Visit Diagnoses     Vitamin D deficiency       Relevant Orders   VITAMIN D 25 Hydroxy (Vit-D Deficiency, Fractures)   Need for hepatitis C screening test       Relevant Orders   Hepatitis C Antibody   Prediabetes       Relevant Orders   Hemoglobin A1c       Meds ordered this encounter  Medications   montelukast (SINGULAIR) 10 MG tablet    Sig: Take 1 tablet (10 mg total) by mouth at bedtime.    Dispense:  30 tablet    Refill:  11   atenolol (TENORMIN) 50 MG tablet    Sig: Take 1 tablet (50 mg total) by mouth daily.    Dispense:  90 tablet    Refill:  3   albuterol (VENTOLIN HFA) 108 (90 Base) MCG/ACT inhaler    Sig:  Inhale 1 puff into the lungs every 6 (six) hours as needed for wheezing or shortness of breath.    Dispense:  18 g    Refill:  3   azelastine (ASTELIN) 0.1 % nasal spray    Sig: Place 2 sprays into both nostrils 2 (two) times daily. Use in each nostril as directed    Dispense:  30 mL    Refill:  5   gabapentin (NEURONTIN) 100 MG capsule    Sig: Take 2 capsules (200 mg total) by mouth at bedtime.    Dispense:  180 capsule    Refill:  3    Follow-up: Return in about 6 months (around 07/11/2023) for HTN and HLD.    Anabel Halon, MD

## 2023-01-10 NOTE — Assessment & Plan Note (Signed)
Lipid profile reviewed Better with Crestor Advised to take fish oil for elevated TGs

## 2023-01-11 ENCOUNTER — Ambulatory Visit (INDEPENDENT_AMBULATORY_CARE_PROVIDER_SITE_OTHER): Payer: 59

## 2023-01-11 DIAGNOSIS — Z0001 Encounter for general adult medical examination with abnormal findings: Secondary | ICD-10-CM | POA: Diagnosis not present

## 2023-01-11 DIAGNOSIS — E782 Mixed hyperlipidemia: Secondary | ICD-10-CM | POA: Diagnosis not present

## 2023-01-11 DIAGNOSIS — R7303 Prediabetes: Secondary | ICD-10-CM | POA: Diagnosis not present

## 2023-01-11 DIAGNOSIS — E559 Vitamin D deficiency, unspecified: Secondary | ICD-10-CM | POA: Diagnosis not present

## 2023-01-11 DIAGNOSIS — Z23 Encounter for immunization: Secondary | ICD-10-CM

## 2023-01-11 DIAGNOSIS — Z1231 Encounter for screening mammogram for malignant neoplasm of breast: Secondary | ICD-10-CM

## 2023-01-11 DIAGNOSIS — I10 Essential (primary) hypertension: Secondary | ICD-10-CM | POA: Diagnosis not present

## 2023-01-11 DIAGNOSIS — Z1159 Encounter for screening for other viral diseases: Secondary | ICD-10-CM | POA: Diagnosis not present

## 2023-01-11 NOTE — Addendum Note (Signed)
Addended by: Everett Graff D on: 01/11/2023 08:30 AM   Modules accepted: Orders

## 2023-01-12 LAB — LIPID PANEL
Chol/HDL Ratio: 4.1 ratio (ref 0.0–4.4)
Cholesterol, Total: 174 mg/dL (ref 100–199)
HDL: 42 mg/dL (ref >39)
LDL Chol Calc (NIH): 90 mg/dL (ref 0–99)
Triglycerides: 249 mg/dL — ABNORMAL HIGH (ref 0–149)
VLDL Cholesterol Cal: 42 mg/dL — ABNORMAL HIGH (ref 5–40)

## 2023-01-12 LAB — CBC WITH DIFFERENTIAL/PLATELET
Basophils Absolute: 0.1 10*3/uL (ref 0.0–0.2)
Basos: 1 %
EOS (ABSOLUTE): 0.4 10*3/uL (ref 0.0–0.4)
Eos: 5 %
Hematocrit: 42.6 % (ref 34.0–46.6)
Hemoglobin: 15 g/dL (ref 11.1–15.9)
Immature Grans (Abs): 0 10*3/uL (ref 0.0–0.1)
Immature Granulocytes: 0 %
Lymphocytes Absolute: 1.7 10*3/uL (ref 0.7–3.1)
Lymphs: 23 %
MCH: 31.9 pg (ref 26.6–33.0)
MCHC: 35.2 g/dL (ref 31.5–35.7)
MCV: 91 fL (ref 79–97)
Monocytes Absolute: 0.6 10*3/uL (ref 0.1–0.9)
Monocytes: 8 %
Neutrophils Absolute: 4.5 10*3/uL (ref 1.4–7.0)
Neutrophils: 63 %
Platelets: 242 10*3/uL (ref 150–450)
RBC: 4.7 x10E6/uL (ref 3.77–5.28)
RDW: 11.7 % (ref 11.7–15.4)
WBC: 7.3 10*3/uL (ref 3.4–10.8)

## 2023-01-12 LAB — CMP14+EGFR
ALT: 20 IU/L (ref 0–32)
AST: 28 IU/L (ref 0–40)
Albumin/Globulin Ratio: 2.2 (ref 1.2–2.2)
Albumin: 4.6 g/dL (ref 3.8–4.9)
Alkaline Phosphatase: 84 IU/L (ref 44–121)
BUN/Creatinine Ratio: 16 (ref 9–23)
BUN: 9 mg/dL (ref 6–24)
Bilirubin Total: 0.7 mg/dL (ref 0.0–1.2)
CO2: 22 mmol/L (ref 20–29)
Calcium: 9.4 mg/dL (ref 8.7–10.2)
Chloride: 102 mmol/L (ref 96–106)
Creatinine, Ser: 0.58 mg/dL (ref 0.57–1.00)
Globulin, Total: 2.1 g/dL (ref 1.5–4.5)
Glucose: 85 mg/dL (ref 70–99)
Potassium: 4.6 mmol/L (ref 3.5–5.2)
Sodium: 139 mmol/L (ref 134–144)
Total Protein: 6.7 g/dL (ref 6.0–8.5)
eGFR: 105 mL/min/{1.73_m2} (ref >59)

## 2023-01-12 LAB — HEMOGLOBIN A1C
Est. average glucose Bld gHb Est-mCnc: 111 mg/dL
Hgb A1c MFr Bld: 5.5 % (ref 4.8–5.6)

## 2023-01-12 LAB — TSH: TSH: 0.892 u[IU]/mL (ref 0.450–4.500)

## 2023-01-12 LAB — VITAMIN D 25 HYDROXY (VIT D DEFICIENCY, FRACTURES): Vit D, 25-Hydroxy: 24.9 ng/mL — ABNORMAL LOW (ref 30.0–100.0)

## 2023-01-12 LAB — HEPATITIS C ANTIBODY: Hep C Virus Ab: NONREACTIVE

## 2023-01-23 ENCOUNTER — Ambulatory Visit
Admission: RE | Admit: 2023-01-23 | Discharge: 2023-01-23 | Disposition: A | Discharge status: Home / Self Care | Payer: 59 | Source: Ambulatory Visit | Attending: Internal Medicine | Admitting: Internal Medicine

## 2023-01-23 DIAGNOSIS — Z1231 Encounter for screening mammogram for malignant neoplasm of breast: Secondary | ICD-10-CM

## 2023-05-26 ENCOUNTER — Other Ambulatory Visit: Payer: Self-pay | Admitting: Internal Medicine

## 2023-05-26 DIAGNOSIS — E782 Mixed hyperlipidemia: Secondary | ICD-10-CM

## 2023-07-18 ENCOUNTER — Ambulatory Visit: Payer: 59 | Admitting: Internal Medicine

## 2023-08-15 ENCOUNTER — Other Ambulatory Visit: Payer: Self-pay | Admitting: Internal Medicine

## 2023-08-15 DIAGNOSIS — E782 Mixed hyperlipidemia: Secondary | ICD-10-CM

## 2023-08-29 ENCOUNTER — Other Ambulatory Visit: Payer: Self-pay

## 2023-08-29 ENCOUNTER — Emergency Department (HOSPITAL_COMMUNITY): Payer: Self-pay

## 2023-08-29 ENCOUNTER — Emergency Department (HOSPITAL_COMMUNITY)
Admission: EM | Admit: 2023-08-29 | Discharge: 2023-08-30 | Disposition: A | Discharge status: Home / Self Care | Payer: Self-pay | Attending: Emergency Medicine | Admitting: Emergency Medicine

## 2023-08-29 ENCOUNTER — Encounter (HOSPITAL_COMMUNITY): Payer: Self-pay

## 2023-08-29 DIAGNOSIS — Z91040 Latex allergy status: Secondary | ICD-10-CM | POA: Insufficient documentation

## 2023-08-29 DIAGNOSIS — R0902 Hypoxemia: Secondary | ICD-10-CM

## 2023-08-29 DIAGNOSIS — J441 Chronic obstructive pulmonary disease with (acute) exacerbation: Secondary | ICD-10-CM | POA: Insufficient documentation

## 2023-08-29 DIAGNOSIS — Z5329 Procedure and treatment not carried out because of patient's decision for other reasons: Secondary | ICD-10-CM | POA: Insufficient documentation

## 2023-08-29 LAB — CBC
HCT: 44 % (ref 36.0–46.0)
Hemoglobin: 14.9 g/dL (ref 12.0–15.0)
MCH: 32.4 pg (ref 26.0–34.0)
MCHC: 33.9 g/dL (ref 30.0–36.0)
MCV: 95.7 fL (ref 80.0–100.0)
Platelets: 257 10*3/uL (ref 150–400)
RBC: 4.6 MIL/uL (ref 3.87–5.11)
RDW: 12.2 % (ref 11.5–15.5)
WBC: 9.3 10*3/uL (ref 4.0–10.5)
nRBC: 0 % (ref 0.0–0.2)

## 2023-08-29 LAB — BASIC METABOLIC PANEL
Anion gap: 9 (ref 5–15)
BUN: 11 mg/dL (ref 6–20)
CO2: 24 mmol/L (ref 22–32)
Calcium: 8.9 mg/dL (ref 8.9–10.3)
Chloride: 106 mmol/L (ref 98–111)
Creatinine, Ser: 0.7 mg/dL (ref 0.44–1.00)
GFR, Estimated: >60 mL/min (ref >60)
Glucose, Bld: 91 mg/dL (ref 70–99)
Potassium: 4.2 mmol/L (ref 3.5–5.1)
Sodium: 139 mmol/L (ref 135–145)

## 2023-08-29 MED ORDER — IPRATROPIUM-ALBUTEROL 0.5-2.5 (3) MG/3ML IN SOLN
3.0000 mL | Freq: Once | RESPIRATORY_TRACT | Status: AC
  Administered 2023-08-29: 3 mL via RESPIRATORY_TRACT
  Filled 2023-08-29: qty 3

## 2023-08-29 MED ORDER — ALBUTEROL SULFATE HFA 108 (90 BASE) MCG/ACT IN AERS
2.0000 | INHALATION_SPRAY | RESPIRATORY_TRACT | Status: DC | PRN
  Filled 2023-08-29: qty 6.7

## 2023-08-29 NOTE — ED Notes (Signed)
Pt walked from triage to CXR Will complete orders once pt returns

## 2023-08-29 NOTE — ED Triage Notes (Signed)
Pt has been having sob for the past two weeks, hx of COPD. Frequently using inhalers. Productive cough with white phlegm. Given a z-pack two weeks ago without improvement. Rhonchi noted throughout
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
## 2023-08-29 NOTE — ED Notes (Signed)
O2 Sats maintaining 89-90% Pt insisted on walking to the restroom without O2 Once pt returned from restroom, placed pt on 2LPM O2 improved to 91%  Pt maintaining 93% on 4LPM O2 via [place] 

## 2023-08-30 ENCOUNTER — Encounter (HOSPITAL_COMMUNITY): Payer: Self-pay | Admitting: *Deleted

## 2023-08-30 ENCOUNTER — Inpatient Hospital Stay (HOSPITAL_COMMUNITY)
Admission: EM | Admit: 2023-08-30 | Discharge: 2023-08-31 | DRG: 190 | Disposition: A | Discharge status: Home / Self Care | Payer: Self-pay | Attending: Internal Medicine | Admitting: Internal Medicine

## 2023-08-30 ENCOUNTER — Other Ambulatory Visit: Payer: Self-pay

## 2023-08-30 DIAGNOSIS — J309 Allergic rhinitis, unspecified: Secondary | ICD-10-CM

## 2023-08-30 DIAGNOSIS — M1990 Unspecified osteoarthritis, unspecified site: Secondary | ICD-10-CM | POA: Diagnosis present

## 2023-08-30 DIAGNOSIS — E782 Mixed hyperlipidemia: Secondary | ICD-10-CM

## 2023-08-30 DIAGNOSIS — J9601 Acute respiratory failure with hypoxia: Secondary | ICD-10-CM

## 2023-08-30 DIAGNOSIS — Z91040 Latex allergy status: Secondary | ICD-10-CM

## 2023-08-30 DIAGNOSIS — Z87891 Personal history of nicotine dependence: Secondary | ICD-10-CM

## 2023-08-30 DIAGNOSIS — Z79899 Other long term (current) drug therapy: Secondary | ICD-10-CM

## 2023-08-30 DIAGNOSIS — R0902 Hypoxemia: Secondary | ICD-10-CM

## 2023-08-30 DIAGNOSIS — J441 Chronic obstructive pulmonary disease with (acute) exacerbation: Principal | ICD-10-CM

## 2023-08-30 DIAGNOSIS — I10 Essential (primary) hypertension: Secondary | ICD-10-CM

## 2023-08-30 DIAGNOSIS — K219 Gastro-esophageal reflux disease without esophagitis: Secondary | ICD-10-CM

## 2023-08-30 LAB — PHOSPHORUS: Phosphorus: 3.1 mg/dL (ref 2.5–4.6)

## 2023-08-30 LAB — HIV ANTIBODY (ROUTINE TESTING W REFLEX): HIV Screen 4th Generation wRfx: NONREACTIVE

## 2023-08-30 LAB — MAGNESIUM: Magnesium: 2 mg/dL (ref 1.7–2.4)

## 2023-08-30 MED ORDER — BUDESONIDE 0.5 MG/2ML IN SUSP
RESPIRATORY_TRACT | Status: AC
  Administered 2023-08-30: 0.5 mg via RESPIRATORY_TRACT
  Filled 2023-08-30: qty 2

## 2023-08-30 MED ORDER — BUDESONIDE 0.5 MG/2ML IN SUSP
0.5000 mg | Freq: Two times a day (BID) | RESPIRATORY_TRACT | Status: DC
  Administered 2023-08-30 – 2023-08-31 (×2): 0.5 mg via RESPIRATORY_TRACT
  Filled 2023-08-30 (×2): qty 2

## 2023-08-30 MED ORDER — ATENOLOL 25 MG PO TABS
50.0000 mg | ORAL_TABLET | Freq: Every day | ORAL | Status: DC
  Filled 2023-08-30 (×2): qty 2

## 2023-08-30 MED ORDER — IPRATROPIUM-ALBUTEROL 0.5-2.5 (3) MG/3ML IN SOLN
3.0000 mL | Freq: Four times a day (QID) | RESPIRATORY_TRACT | Status: DC
  Administered 2023-08-30 – 2023-08-31 (×5): 3 mL via RESPIRATORY_TRACT
  Filled 2023-08-30 (×5): qty 3

## 2023-08-30 MED ORDER — GABAPENTIN 100 MG PO CAPS
200.0000 mg | ORAL_CAPSULE | Freq: Every day | ORAL | Status: DC
  Administered 2023-08-30: 200 mg via ORAL
  Filled 2023-08-30: qty 2

## 2023-08-30 MED ORDER — ROSUVASTATIN CALCIUM 10 MG PO TABS
10.0000 mg | ORAL_TABLET | Freq: Every day | ORAL | Status: DC
  Administered 2023-08-30 – 2023-08-31 (×2): 10 mg via ORAL
  Filled 2023-08-30 (×2): qty 1

## 2023-08-30 MED ORDER — ENOXAPARIN SODIUM 40 MG/0.4ML IJ SOSY
40.0000 mg | PREFILLED_SYRINGE | INTRAMUSCULAR | Status: DC
  Administered 2023-08-30: 40 mg via SUBCUTANEOUS
  Filled 2023-08-30: qty 0.4

## 2023-08-30 MED ORDER — ONDANSETRON HCL 4 MG/2ML IJ SOLN: 4.0000 mg | Freq: Four times a day (QID) | INTRAMUSCULAR | Status: DC | PRN

## 2023-08-30 MED ORDER — MONTELUKAST SODIUM 10 MG PO TABS
10.0000 mg | ORAL_TABLET | Freq: Every day | ORAL | Status: DC
  Administered 2023-08-30: 10 mg via ORAL
  Filled 2023-08-30: qty 1

## 2023-08-30 MED ORDER — IPRATROPIUM-ALBUTEROL 0.5-2.5 (3) MG/3ML IN SOLN
RESPIRATORY_TRACT | Status: AC
  Administered 2023-08-30: 3 mL via RESPIRATORY_TRACT
  Filled 2023-08-30: qty 3

## 2023-08-30 MED ORDER — IPRATROPIUM-ALBUTEROL 0.5-2.5 (3) MG/3ML IN SOLN
3.0000 mL | RESPIRATORY_TRACT | Status: AC
  Administered 2023-08-30 (×2): 3 mL via RESPIRATORY_TRACT
  Filled 2023-08-30: qty 6

## 2023-08-30 MED ORDER — LORATADINE 10 MG PO TABS
10.0000 mg | ORAL_TABLET | Freq: Every day | ORAL | Status: DC
  Administered 2023-08-30 – 2023-08-31 (×2): 10 mg via ORAL
  Filled 2023-08-30 (×2): qty 1

## 2023-08-30 MED ORDER — ONDANSETRON HCL 4 MG PO TABS: 4.0000 mg | ORAL_TABLET | Freq: Four times a day (QID) | ORAL | Status: DC | PRN

## 2023-08-30 MED ORDER — PREDNISONE 20 MG PO TABS: ORAL_TABLET | ORAL | 0 refills | Status: DC

## 2023-08-30 MED ORDER — METHYLPREDNISOLONE SODIUM SUCC 125 MG IJ SOLR
125.0000 mg | Freq: Once | INTRAMUSCULAR | Status: AC
  Administered 2023-08-30: 125 mg via INTRAVENOUS
  Filled 2023-08-30: qty 2

## 2023-08-30 MED ORDER — ALBUTEROL SULFATE (2.5 MG/3ML) 0.083% IN NEBU
5.0000 mg | INHALATION_SOLUTION | Freq: Once | RESPIRATORY_TRACT | Status: AC
  Administered 2023-08-30: 5 mg via RESPIRATORY_TRACT
  Filled 2023-08-30: qty 6

## 2023-08-30 MED ORDER — DM-GUAIFENESIN ER 30-600 MG PO TB12
1.0000 | ORAL_TABLET | Freq: Two times a day (BID) | ORAL | Status: DC
  Administered 2023-08-30 – 2023-08-31 (×3): 1 via ORAL
  Filled 2023-08-30 (×3): qty 1

## 2023-08-30 MED ORDER — ACETAMINOPHEN 325 MG PO TABS
650.0000 mg | ORAL_TABLET | Freq: Four times a day (QID) | ORAL | Status: DC | PRN
  Administered 2023-08-30: 650 mg via ORAL
  Filled 2023-08-30: qty 2

## 2023-08-30 MED ORDER — DOXYCYCLINE HYCLATE 100 MG PO TABS
100.0000 mg | ORAL_TABLET | Freq: Once | ORAL | Status: AC
  Administered 2023-08-30: 100 mg via ORAL
  Filled 2023-08-30: qty 1

## 2023-08-30 MED ORDER — SODIUM CHLORIDE 0.9% FLUSH
3.0000 mL | Freq: Two times a day (BID) | INTRAVENOUS | Status: DC
  Administered 2023-08-30 – 2023-08-31 (×3): 3 mL via INTRAVENOUS

## 2023-08-30 MED ORDER — PANTOPRAZOLE SODIUM 40 MG PO TBEC
40.0000 mg | DELAYED_RELEASE_TABLET | Freq: Every day | ORAL | Status: DC
  Administered 2023-08-30 – 2023-08-31 (×2): 40 mg via ORAL
  Filled 2023-08-30 (×2): qty 1

## 2023-08-30 MED ORDER — SODIUM CHLORIDE 0.9% FLUSH: 3.0000 mL | INTRAVENOUS | Status: DC | PRN

## 2023-08-30 MED ORDER — SODIUM CHLORIDE 0.9 % IV SOLN: 250.0000 mL | INTRAVENOUS | Status: DC | PRN

## 2023-08-30 MED ORDER — DOXYCYCLINE HYCLATE 100 MG PO CAPS: 100.0000 mg | ORAL_CAPSULE | Freq: Two times a day (BID) | ORAL | 0 refills | Status: DC

## 2023-08-30 MED ORDER — ACETAMINOPHEN 650 MG RE SUPP: 650.0000 mg | Freq: Four times a day (QID) | RECTAL | Status: DC | PRN

## 2023-08-30 MED ORDER — DOXYCYCLINE HYCLATE 100 MG PO TABS
100.0000 mg | ORAL_TABLET | Freq: Two times a day (BID) | ORAL | Status: DC
  Administered 2023-08-30 – 2023-08-31 (×3): 100 mg via ORAL
  Filled 2023-08-30 (×3): qty 1

## 2023-08-30 MED ORDER — ALBUTEROL SULFATE (2.5 MG/3ML) 0.083% IN NEBU: 2.5000 mg | INHALATION_SOLUTION | RESPIRATORY_TRACT | Status: DC | PRN

## 2023-08-30 MED ORDER — METHYLPREDNISOLONE SODIUM SUCC 40 MG IJ SOLR
40.0000 mg | Freq: Two times a day (BID) | INTRAMUSCULAR | Status: DC
  Administered 2023-08-30 – 2023-08-31 (×3): 40 mg via INTRAVENOUS
  Filled 2023-08-30 (×3): qty 1

## 2023-08-30 NOTE — Discharge Instructions (Addendum)
Your oxygen levels are very low and this is why you are having trouble breathing.   You do not realize it but this can and will likely get worse if not treated appropriately and monitoring in the hospital is the best option.  He could get to the point where your CO2 get so high that you are confused and altered and not able to return to the hospital or make a phone call also please return as soon as possible for reevaluation and likely admission.  If this gets much worse that has a high probability of death.

## 2023-08-30 NOTE — H&P (Signed)
History and Physical    Patient: Gabriella Vance ZOX:[id number] DOB: Apr 15, 1966 DOA: 08/30/2023 DOS: the patient was seen and examined on 08/30/2023 PCP: Anabel Halon, MD  Patient coming from: Home  Chief Complaint:  Chief Complaint  Patient presents with   COPD   Shortness of Breath   HPI: Gabriella Vance is a 57 y.o. female with medical history significant of COPD, allergy rhinitis, hypertension, hyperlipidemia and GERD; who presented to the hospital secondary to shortness of breath, increased wheezing and dyspnea exertion.  Patient reports symptom has been present for the last 3-4 days and worsening.  She can notice significant difficulty completing her activities of daily living and on the day of admission given expressed gasping for air and having difficulty catching her breath even at rest.  No fever, no chest pain, no nausea, no vomiting, no focal weaknesses, no dysuria, no hematuria, no sick contacts.  On arrival to the ED patient was found to be hypoxic on room air (saturation in the mid 80s), initially she in the requiring 4 L supplementation to maintain saturations above 90%.  Chest x-ray demonstrating significant emphysematous and bronchitic changes.  Stable vital signs and otherwise normal blood work.  Steroids and nebulizer treatments were provided.  Some improvement appreciated in her condition.  She was initially tried to go home and even left AGAINST MEDICAL ADVICE for a short period of time.  Unfortunately was still feeling very tight and after arranging things at home she will return back for further evaluation, management and admission.    TRH consulted to place patient in the hospital for COPD exacerbation with acute respiratory failure and hypoxia  Review of Systems: As mentioned in the history of present illness. All other systems reviewed and are negative. Past Medical History:  Diagnosis Date   Arthritis    COPD (chronic obstructive pulmonary disease) (HCC)     Hypertension    Past Surgical History:  Procedure Laterality Date   CHOLECYSTECTOMY     HIATAL HERNIA REPAIR     LUNG SURGERY     TENDON REPAIR     Social History:  reports that she has quit smoking. She has never used smokeless tobacco. She reports that she does not drink alcohol and does not use drugs.  Allergies  Allergen Reactions   Latex Anaphylaxis, Shortness Of Breath and Rash    History reviewed. No pertinent family history.  Prior to Admission medications   Medication Sig Start Date End Date Taking? Authorizing Provider  albuterol (VENTOLIN HFA) 108 (90 Base) MCG/ACT inhaler Inhale 1 puff into the lungs every 6 (six) hours as needed for wheezing or shortness of breath. Patient taking differently: Inhale 2 puffs into the lungs every 6 (six) hours as needed for wheezing or shortness of breath. 01/10/23  Yes Anabel Halon, MD  atenolol (TENORMIN) 50 MG tablet Take 1 tablet (50 mg total) by mouth daily. 01/10/23  Yes Anabel Halon, MD  azelastine (ASTELIN) 0.1 % nasal spray Place 2 sprays into both nostrils 2 (two) times daily. Use in each nostril as directed 01/10/23  Yes Anabel Halon, MD  cetirizine (ZYRTEC) 10 MG tablet Take 10 mg by mouth daily.   Yes [provider]  gabapentin (NEURONTIN) 100 MG capsule Take 2 capsules (200 mg total) by mouth at bedtime. 01/10/23  Yes Anabel Halon, MD  guaiFENesin (MUCINEX) 600 MG 12 hr tablet Take by mouth 2 (two) times daily.   Yes [provider]  ibuprofen (  ADVIL,MOTRIN) 200 MG tablet Take 400 mg by mouth every 6 (six) hours as needed for mild pain.    Yes [provider]  montelukast (SINGULAIR) 10 MG tablet Take 1 tablet (10 mg total) by mouth at bedtime. 01/10/23  Yes Anabel Halon, MD  Multiple Vitamin (MULTIVITAMIN WITH MINERALS) TABS Take 1 tablet by mouth daily.   Yes [provider]  rosuvastatin (CRESTOR) 10 MG tablet Take 1 tablet by mouth once daily 08/15/23  Yes Anabel Halon, MD   doxycycline (VIBRAMYCIN) 100 MG capsule Take 1 capsule (100 mg total) by mouth 2 (two) times daily. One po bid x 7 days Patient not taking: Reported on 08/30/2023 08/30/23   Mesner, Barbara Cower, MD  fexofenadine (ALLEGRA) 180 MG tablet Take 180 mg by mouth daily. Patient not taking: Reported on 08/30/2023    [provider]  predniSONE (DELTASONE) 20 MG tablet 2 tabs po daily x 4 days 08/30/23   Marily Memos, MD    Physical Exam: Vitals:   08/30/23 1007 08/30/23 1345 08/30/23 1529 08/30/23 1812  BP: 116/60 (!) 119/49  130/70  Pulse: 60 60  60  Resp: 20 18  18   Temp: 98 F (36.7 C) 98 F (36.7 C)  98.1 F (36.7 C)  TempSrc: Oral Oral  Oral  SpO2: 97% 97% 97% 97%   General exam: Alert, awake, oriented x 3; no chest pain, no nausea, no vomiting.  Mild difficulty speaking in full sentences.  Afebrile. Respiratory system: Diffuse expiratory wheezing, no using accessory muscles, fair air movement. Cardiovascular system:RRR. No rubs or gallops; no JVD. Gastrointestinal system: Abdomen is nondistended, soft and nontender. No organomegaly or masses felt. Normal bowel sounds heard. Central nervous system: Alert and oriented. No focal neurological deficits. Extremities: No cyanosis or clubbing. Skin: No petechiae. Psychiatry: Judgement and insight appear normal. Mood & affect appropriate.   Data Reviewed: CBC: White blood cells 9.3, hemoglobin 14.9, platelet count 257K Basic metabolic panel: Sodium 139, potassium 4.2, chloride 102, bicarb 24, BUN 11, creatinine 0.70 and GFR >60 Magnesium: 2.0 Phosphorus 3.1  Assessment and Plan: * COPD exacerbation (HCC) -Significant expiratory wheezing, difficulty speaking in full sentences and new oxygen requirement present at time of admission. -Patient reported mildly productive coughing spells. -Patient admitted to the hospital for IV steroids, nebulizer management, rescue bronchodilators, antibiotics and mucolytic's -Flutter valve has been  ordered and instructed. -Will provide supportive care and follow clinical response -Wean off oxygen supplementation as tolerated; currently 2 L needed to maintain saturation above 90-92%.  GERD (gastroesophageal reflux disease) -With prior history of hiatal hernia -Protonix will be provided for gastroesophageal reflux symptoms and GI prophylaxis while using steroids.  Allergic rhinitis -Continue the use of Allegra equivalent (loratadine) -No significant nasal congestion or epistaxis reported at the moment.  Acute respiratory failure with hypoxia (HCC) -On first arrival patient requiring 4 L to maintain saturation above 90%; 86-88% on room air per ED chart review. -Patient's hypoxemic event associated with COPD exacerbation -Please refer to COPD problem and for details regarding treatment/management.  Mixed hyperlipidemia -Heart healthy diet discussed with patient -Continue the use of Crestor.  HTN (hypertension) -Stable overall -Heart healthy diet discussed with patient -Continue current antihypertensive agents -Follow vital signs.      Advance Care Planning:   Code Status: Full Code   Consults: None  Family Communication: No family at bedside.  Severity of Illness: The appropriate patient status for this patient is INPATIENT. Inpatient status is judged to be reasonable and necessary  in order to provide the required intensity of service to ensure the patient's safety. The patient's presenting symptoms, physical exam findings, and initial radiographic and laboratory data in the context of their chronic comorbidities is felt to place them at high risk for further clinical deterioration. Furthermore, it is not anticipated that the patient will be medically stable for discharge from the hospital within 2 midnights of admission.   * I certify that at the point of admission it is my clinical judgment that the patient will require inpatient hospital care spanning beyond 2 midnights  from the point of admission due to high intensity of service, high risk for further deterioration and high frequency of surveillance required.*  Author: Vassie Loll, MD 08/30/2023 6:51 PM  For on call review [email or web address].

## 2023-08-30 NOTE — ED Notes (Signed)
RT at bedside.

## 2023-08-30 NOTE — Assessment & Plan Note (Signed)
-  With prior history of hiatal hernia -Protonix will be provided for gastroesophageal reflux symptoms and GI prophylaxis while using steroids.

## 2023-08-30 NOTE — Assessment & Plan Note (Signed)
-  Very little expiratory wheezing appreciated at time of discharge; speaking in full sentences and not requiring oxygen supplementation.  Patient felt ready to go home. -Outpatient follow-up with pulmonologist for PFTs has been encourage -Discharge on steroids tapering, completion of oral doxycycline and resumption of as needed albuterol and twice a day maintenance Symbicort. -Prescription for mucolytic's using Mucinex-dextromethorphan and instruction for flutter valve has been provided.

## 2023-08-30 NOTE — Assessment & Plan Note (Signed)
-  Stable overall -Heart healthy diet discussed with patient -Continue prior to admission antihypertensive regimen.

## 2023-08-30 NOTE — Assessment & Plan Note (Signed)
-  Heart healthy diet discussed with patient. -Continue the use of Crestor

## 2023-08-30 NOTE — Assessment & Plan Note (Signed)
-  Continue the use of Allegra equivalent (loratadine) -No significant nasal congestion or epistaxis reported at the moment.

## 2023-08-30 NOTE — ED Triage Notes (Signed)
Pt here earlier for COPD exacerbation and had to leave due to other obligations. Wheezing noted throughout. Denies pain at present

## 2023-08-30 NOTE — Assessment & Plan Note (Signed)
-  On first arrival patient requiring 4 L to maintain saturation above 90%; 86-88% on room air per ED chart review. -Patient's hypoxemic event associated with COPD exacerbation -Please refer to COPD problem and for details regarding treatment/management.

## 2023-08-30 NOTE — ED Notes (Signed)
Pt wanted to leave AMA, EDP made aware. EDP discussed with pt. Pt up for discharge

## 2023-08-30 NOTE — ED Provider Notes (Signed)
[name] EMERGENCY DEPARTMENT AT [facility] Provider Note   CSN: [id number] Arrival date & time: 08/30/23  0540     History  Chief Complaint  Patient presents with   COPD   Shortness of Breath    Gabriella Vance is a 57 y.o. female.  Please see note from recent ED visit when she left AGAINST MEDICAL ADVICE for full history.  Patient return for admission.  In short patient with a week of worsening symptoms consistent with COPD exacerbation.  Tried inhaler at home did not help.  Does not have a pulmonologist.  On the hospital localization she had multiple DuoNebs, prednisone and antibiotics.  Was still tachypneic and tight required oxygen.  Now she is Still tachypneic and coughing.  Her animals are situated.  She agrees to admission at this time. No other changes   COPD Associated symptoms include shortness of breath.  Shortness of Breath      Home Medications Prior to Admission medications   Medication Sig Start Date End Date Taking? Authorizing Provider  albuterol (VENTOLIN HFA) 108 (90 Base) MCG/ACT inhaler Inhale 1 puff into the lungs every 6 (six) hours as needed for wheezing or shortness of breath. 01/10/23   Anabel Halon, MD  atenolol (TENORMIN) 50 MG tablet Take 1 tablet (50 mg total) by mouth daily. 01/10/23   Anabel Halon, MD  azelastine (ASTELIN) 0.1 % nasal spray Place 2 sprays into both nostrils 2 (two) times daily. Use in each nostril as directed 01/10/23   Anabel Halon, MD  doxycycline (VIBRAMYCIN) 100 MG capsule Take 1 capsule (100 mg total) by mouth 2 (two) times daily. One po bid x 7 days 08/30/23   Shadman Tozzi, Barbara Cower, MD  fexofenadine (ALLEGRA) 180 MG tablet Take 180 mg by mouth daily.    [provider]  gabapentin (NEURONTIN) 100 MG capsule Take 2 capsules (200 mg total) by mouth at bedtime. 01/10/23   Anabel Halon, MD  ibuprofen (ADVIL,MOTRIN) 200 MG tablet Take 400 mg by mouth every 6 (six) hours as needed for mild pain.     [provider]  montelukast (SINGULAIR) 10 MG tablet Take 1 tablet (10 mg total) by mouth at bedtime. 01/10/23   Anabel Halon, MD  Multiple Vitamin (MULTIVITAMIN WITH MINERALS) TABS Take 1 tablet by mouth daily.    [provider]  predniSONE (DELTASONE) 20 MG tablet 2 tabs po daily x 4 days 08/30/23   Katherina Wimer, Barbara Cower, MD  rosuvastatin (CRESTOR) 10 MG tablet Take 1 tablet by mouth once daily 08/15/23   Anabel Halon, MD      Allergies    Latex    Review of Systems   Review of Systems  Respiratory:  Positive for shortness of breath.     Physical Exam Updated Vital Signs BP (!) 115/98   Pulse 75   Temp 97.7 F (36.5 C) (Oral)   Resp (!) 24   SpO2 92%  Physical Exam Vitals and nursing note reviewed.  Constitutional:      Appearance: She is well-developed.  HENT:     Head: Normocephalic and atraumatic.  Cardiovascular:     Rate and Rhythm: Normal rate and regular rhythm.  Pulmonary:     Effort: Tachypnea present. No respiratory distress.     Breath sounds: No stridor. Decreased breath sounds and wheezing present.  Chest:     Chest wall: No tenderness.  Abdominal:     General: There is no distension.  Musculoskeletal:     Cervical back: Normal range of motion.  Neurological:     Mental Status: She is alert.     ED Results / Procedures / Treatments   Labs (all labs ordered are listed, but only abnormal results are displayed) Labs Reviewed - No data to display  EKG None  Radiology DG Chest 2 View  Result Date: 08/29/2023 CLINICAL DATA:  Short of breath for 2 weeks, COPD, productive cough EXAM: CHEST - 2 VIEW COMPARISON:  02/09/2021 FINDINGS: Frontal and lateral views of the chest demonstrate an unremarkable cardiac silhouette. Lungs are hyperinflated, with stable background emphysema. No acute airspace disease, effusion, or pneumothorax. No acute bony abnormalities. IMPRESSION: 1. Emphysema.  No acute airspace disease. Electronically Signed   By: Sharlet Salina M.D.   On: 08/29/2023 21:22    Procedures Procedures    Medications Ordered in ED Medications  albuterol (PROVENTIL) (2.5 MG/3ML) 0.083% nebulizer solution 5 mg (has no administration in time range)    ED Course/ Medical Decision Making/ A&P                                 Medical Decision Making Risk Prescription drug management. Decision regarding hospitalization.   COPD exacerbation.  Still tachypneic, mildly hypoxic.  No longer requiring 4 L but still requiring oxygen.  Patient does appear little bit improved from earlier visit however with hypoxia, tachypnea and tight lung sounds are still feel like she warrants admission.  Will give another nebulizer right now.  I do not see any indication to repeat labs and x-rays at this time as the others were done within the last 12 hours.    Final Clinical Impression(s) / ED Diagnoses Final diagnoses:  COPD exacerbation (HCC)  Hypoxia    Rx / DC Orders ED Discharge Orders     None         Alik Mawson, Barbara Cower, MD 08/30/23 [phone]

## 2023-08-30 NOTE — ED Provider Notes (Signed)
[place] EMERGENCY DEPARTMENT AT [facility] Provider Note   CSN: [id number] Arrival date & time: 08/29/23  2034     History  Chief Complaint  Patient presents with   Shortness of Breath    Gabriella Vance is a 57 y.o. female.  Patient presents the ER today for couple weeks worth of progressive worsening shortness of breath with productive cough and chills no fevers.  Patient states that she completed a Z-Pak which did not seem to help much.  She has not tried any steroids.  She has been using her albuterol inhaler at home which helps a little bit for not very long.  She has persistent tachypnea, cough, shortness of breath and just feeling unwell.  She states that she has had a couple episodes like this over the years where she had to be admitted.  She does not currently have a pulmonologist.  Does not wear oxygen at home.  No recent sick contacts.  No other associated symptoms.   Shortness of Breath      Home Medications Prior to Admission medications   Medication Sig Start Date End Date Taking? Authorizing Provider  doxycycline (VIBRAMYCIN) 100 MG capsule Take 1 capsule (100 mg total) by mouth 2 (two) times daily. One po bid x 7 days 08/30/23  Yes Shaheim Mahar, Barbara Cower, MD  predniSONE (DELTASONE) 20 MG tablet 2 tabs po daily x 4 days 08/30/23  Yes Aralyn Nowak, Barbara Cower, MD  albuterol (VENTOLIN HFA) 108 (90 Base) MCG/ACT inhaler Inhale 1 puff into the lungs every 6 (six) hours as needed for wheezing or shortness of breath. 01/10/23   Anabel Halon, MD  atenolol (TENORMIN) 50 MG tablet Take 1 tablet (50 mg total) by mouth daily. 01/10/23   Anabel Halon, MD  azelastine (ASTELIN) 0.1 % nasal spray Place 2 sprays into both nostrils 2 (two) times daily. Use in each nostril as directed 01/10/23   Anabel Halon, MD  fexofenadine (ALLEGRA) 180 MG tablet Take 180 mg by mouth daily.    [provider]  gabapentin (NEURONTIN) 100 MG capsule Take 2 capsules (200 mg total) by mouth at  bedtime. 01/10/23   Anabel Halon, MD  ibuprofen (ADVIL,MOTRIN) 200 MG tablet Take 400 mg by mouth every 6 (six) hours as needed for mild pain.     [provider]  montelukast (SINGULAIR) 10 MG tablet Take 1 tablet (10 mg total) by mouth at bedtime. 01/10/23   Anabel Halon, MD  Multiple Vitamin (MULTIVITAMIN WITH MINERALS) TABS Take 1 tablet by mouth daily.    [provider]  rosuvastatin (CRESTOR) 10 MG tablet Take 1 tablet by mouth once daily 08/15/23   Anabel Halon, MD      Allergies    Latex    Review of Systems   Review of Systems  Respiratory:  Positive for shortness of breath.     Physical Exam Updated Vital Signs BP 102/64   Pulse 68   Temp 97.9 F (36.6 C) (Oral)   Resp 19   Ht 5\' 10"  (1.778 m)   Wt 77.1 kg   SpO2 93%   BMI 24.39 kg/m  Physical Exam Vitals and nursing note reviewed.  Constitutional:      General: She is in acute distress.     Appearance: She is well-developed.  HENT:     Head: Normocephalic and atraumatic.  Cardiovascular:     Rate and Rhythm: Normal rate and regular rhythm.  Pulmonary:  Effort: No tachypnea or respiratory distress.     Breath sounds: Decreased air movement present. No stridor. Decreased breath sounds and wheezing present.  Abdominal:     General: There is no distension.  Musculoskeletal:     Cervical back: Normal range of motion.  Neurological:     Mental Status: She is alert.     ED Results / Procedures / Treatments   Labs (all labs ordered are listed, but only abnormal results are displayed) Labs Reviewed  BASIC METABOLIC PANEL  CBC    EKG EKG Interpretation Date/Time:  Thursday August 29 2023 22:03:19 EDT Ventricular Rate:  71 PR Interval:  125 QRS Duration:  90 QT Interval:  409 QTC Calculation: 445 R Axis:   50  Text Interpretation: Sinus rhythm Confirmed by Marily Memos [phone]) on 08/29/2023 11:05:37 PM  Radiology DG Chest 2 View  Result Date: 08/29/2023 CLINICAL DATA:   Short of breath for 2 weeks, COPD, productive cough EXAM: CHEST - 2 VIEW COMPARISON:  02/09/2021 FINDINGS: Frontal and lateral views of the chest demonstrate an unremarkable cardiac silhouette. Lungs are hyperinflated, with stable background emphysema. No acute airspace disease, effusion, or pneumothorax. No acute bony abnormalities. IMPRESSION: 1. Emphysema.  No acute airspace disease. Electronically Signed   By: Sharlet Salina M.D.   On: 08/29/2023 21:22    Procedures .Critical Care  Performed by: Marily Memos, MD Authorized by: Marily Memos, MD   Critical care provider statement:    Critical care time (minutes):  30   Critical care was necessary to treat or prevent imminent or life-threatening deterioration of the following conditions:  Respiratory failure   Critical care was time spent personally by me on the following activities:  Development of treatment plan with patient or surrogate, discussions with consultants, evaluation of patient's response to treatment, examination of patient, ordering and review of laboratory studies, ordering and review of radiographic studies, ordering and performing treatments and interventions, pulse oximetry, re-evaluation of patient's condition and review of old charts     Medications Ordered in ED Medications  ipratropium-albuterol (DUONEB) 0.5-2.5 (3) MG/3ML nebulizer solution 3 mL (3 mLs Nebulization Given 08/29/23 2121)  ipratropium-albuterol (DUONEB) 0.5-2.5 (3) MG/3ML nebulizer solution 3 mL (3 mLs Nebulization Given by Other 08/30/23 0122)  methylPREDNISolone sodium succinate (SOLU-MEDROL) 125 mg/2 mL injection 125 mg (125 mg Intravenous Given 08/30/23 0123)  doxycycline (VIBRA-TABS) tablet 100 mg (100 mg Oral Given 08/30/23 0123)    ED Course/ Medical Decision Making/ A&P                                 Medical Decision Making Amount and/or Complexity of Data Reviewed Labs: ordered. Radiology: ordered.  Risk Prescription drug  management.   Here with acute respiratory failure and hypoxia secondary to likely COPD exacerbation.  Lungs opened up a little bit with breathing treatment steroids and antibiotics however was persistently tachypneic even on 4 L of oxygen to maintain saturations above 90%.  Multiple discussions with her about be admitted to the hospital.  Ultimately patient decided she wanted to leave.  She states that she had to go feed her pets to make sure that they were to be taken care of while she was in the hospital.  I discussed that she could get still hypoxic or hypercarbic that she is altered confused and not able to actually call 911 to return I really wish she would stay here because she would have  high chance of dying if she went home.  She states that she promises to come back.  She is alert and oriented.  She is competent to make her own decisions and she is competent at this time to make this decision.  I cannot send her home on any oxygen.  I treated with the best of my abilities.  Put in a consult for pulmonology and sent her home on appropriate medications case she does not come back.  Ultimately patient left AGAINST MEDICAL ADVICE.  I did advise her that it should come back anytime for reevaluation and likely admission.  She stated understanding.  Final Clinical Impression(s) / ED Diagnoses Final diagnoses:  COPD exacerbation (HCC)  Hypoxia    Rx / DC Orders ED Discharge Orders          Ordered    doxycycline (VIBRAMYCIN) 100 MG capsule  2 times daily        08/30/23 0327    predniSONE (DELTASONE) 20 MG tablet        08/30/23 0327    Ambulatory referral to Pulmonology  Status:  Canceled        08/30/23 0328    Ambulatory referral to Pulmonology        08/30/23 0329              Severino Paolo, Barbara Cower, MD 08/30/23 [phone]

## 2023-08-30 NOTE — ED Notes (Signed)
ED TO INPATIENT HANDOFF REPORT  ED Nurse Name and Phone #: Wandra Mannan, Paramedic [phone]  S Name/Age/Gender Gabriella Vance 57 y.o. female Room/Bed: APA01/APA01  Code Status   Code Status: Full Code  Home/SNF/Other Home Patient oriented to: self, place, time, and situation Is this baseline? Yes   Triage Complete: Triage complete  Chief Complaint COPD exacerbation [facility]) [J44.1]  Triage Note Pt here earlier for COPD exacerbation and had to leave due to other obligations. Wheezing noted throughout. Denies pain at present   Allergies Allergies  Allergen Reactions   Latex Anaphylaxis, Shortness Of Breath and Rash    Level of Care/Admitting Diagnosis ED Disposition     ED Disposition  Admit   Condition  --   Comment  Hospital Area: [facility] [[id number]]  Level of Care: Telemetry [5]  Covid Evaluation: Asymptomatic - no recent exposure (last 10 days) testing not required  Diagnosis: COPD exacerbation [facility]) [[id number]]  Admitting Physician: Lilyan Gilford [[id number]]  Attending Physician: Lilyan Gilford [[id number]]  Certification:: I certify this patient will need inpatient services for at least 2 midnights  Expected Medical Readiness: 09/01/2023          B Medical/Surgery History Past Medical History:  Diagnosis Date   Arthritis    COPD (chronic obstructive pulmonary disease) (HCC)    Hypertension    Past Surgical History:  Procedure Laterality Date   CHOLECYSTECTOMY     HIATAL HERNIA REPAIR     LUNG SURGERY     TENDON REPAIR       A IV Location/Drains/Wounds Patient Lines/Drains/Airways Status     Active Line/Drains/Airways     Name Placement date Placement time Site Days   Peripheral IV 08/30/23 22 G Anterior;Left Forearm 08/30/23  0637  Forearm  less than 1            Intake/Output Last 24 hours No intake or output data in the 24 hours ending 08/30/23 1813  Labs/Imaging Results for orders placed or performed  during the hospital encounter of 08/30/23 (from the past 48 hour(s))  HIV Antibody (routine testing w rflx)     Status: None   Collection Time: 08/30/23  8:13 AM  Result Value Ref Range   HIV Screen 4th Generation wRfx Non Reactive Non Reactive    Comment: Performed at [facility], [address] [place], [place] [address]  Magnesium     Status: None   Collection Time: 08/30/23  8:13 AM  Result Value Ref Range   Magnesium 2.0 1.7 - 2.4 mg/dL    Comment: Performed at [facility], [address] [place], [place] [address]  Phosphorus     Status: None   Collection Time: 08/30/23  8:13 AM  Result Value Ref Range   Phosphorus 3.1 2.5 - 4.6 mg/dL    Comment: Performed at [facility], [address], [place], [place] [address]   DG Chest 2 View  Result Date: 08/29/2023 CLINICAL DATA:  Short of breath for 2 weeks, COPD, productive cough EXAM: CHEST - 2 VIEW COMPARISON:  02/09/2021 FINDINGS: Frontal and lateral views of the chest demonstrate an unremarkable cardiac silhouette. Lungs are hyperinflated, with stable background emphysema. No acute airspace disease, effusion, or pneumothorax. No acute bony abnormalities. IMPRESSION: 1. Emphysema.  No acute airspace disease. Electronically Signed   By: Sharlet Salina M.D.   On: 08/29/2023 21:22    Pending Labs Unresulted Labs (From admission, onward)     Start  Ordered   09/06/23 0500  Creatinine, serum  (enoxaparin (LOVENOX)    CrCl >/= 30 ml/min)  Weekly,   R     Comments: while on enoxaparin therapy    08/30/23 0801   08/31/23 0500  Basic metabolic panel  Tomorrow morning,   R        08/30/23 0801   08/31/23 0500  CBC  Tomorrow morning,   R        08/30/23 0801            Vitals/Pain Today's Vitals   08/30/23 1007 08/30/23 1345 08/30/23 1529 08/30/23 1812  BP: 116/60 (!) 119/49  130/70  Pulse: 60 60  60  Resp: 20 18  18   Temp: 98 F (36.7 C) 98 F (36.7 C)  98.1 F (36.7 C)  TempSrc: Oral Oral  Oral  SpO2: 97% 97%  97% 97%  PainSc:  0-No pain      Isolation Precautions No active isolations  Medications Medications  doxycycline (VIBRA-TABS) tablet 100 mg (100 mg Oral Given 08/30/23 0928)  albuterol (PROVENTIL) (2.5 MG/3ML) 0.083% nebulizer solution 2.5 mg (has no administration in time range)  ipratropium-albuterol (DUONEB) 0.5-2.5 (3) MG/3ML nebulizer solution 3 mL (3 mLs Nebulization Given 08/30/23 1529)  methylPREDNISolone sodium succinate (SOLU-MEDROL) 40 mg/mL injection 40 mg (40 mg Intravenous Given 08/30/23 0929)  budesonide (PULMICORT) nebulizer solution 0.5 mg (0.5 mg Nebulization Given 08/30/23 0838)  enoxaparin (LOVENOX) injection 40 mg (has no administration in time range)  dextromethorphan-guaiFENesin (MUCINEX DM) 30-600 MG per 12 hr tablet 1 tablet (1 tablet Oral Given 08/30/23 0928)  pantoprazole (PROTONIX) EC tablet 40 mg (40 mg Oral Given 08/30/23 0928)  sodium chloride flush (NS) 0.9 % injection 3 mL (3 mLs Intravenous Given 08/30/23 0929)  sodium chloride flush (NS) 0.9 % injection 3 mL (has no administration in time range)  0.9 %  sodium chloride infusion (has no administration in time range)  acetaminophen (TYLENOL) tablet 650 mg (650 mg Oral Given 08/30/23 0935)    Or  acetaminophen (TYLENOL) suppository 650 mg ( Rectal See Alternative 08/30/23 0935)  ondansetron (ZOFRAN) tablet 4 mg (has no administration in time range)    Or  ondansetron (ZOFRAN) injection 4 mg (has no administration in time range)  atenolol (TENORMIN) tablet 50 mg (50 mg Oral Patient Refused/Not Given 08/30/23 0928)  loratadine (CLARITIN) tablet 10 mg (10 mg Oral Given 08/30/23 0928)  gabapentin (NEURONTIN) capsule 200 mg (has no administration in time range)  montelukast (SINGULAIR) tablet 10 mg (has no administration in time range)  rosuvastatin (CRESTOR) tablet 10 mg (10 mg Oral Given 08/30/23 0928)  albuterol (PROVENTIL) (2.5 MG/3ML) 0.083% nebulizer solution 5 mg (5 mg Nebulization Given 08/30/23 5284)     Mobility walks     Focused Assessments Pulmonary Assessment Handoff:  Lung sounds: Bilateral Breath Sounds: Expiratory wheezes L Breath Sounds: Expiratory wheezes, Inspiratory wheezes R Breath Sounds: Expiratory wheezes, Inspiratory wheezes O2 Device: Nasal Cannula O2 Flow Rate (L/min): 2 L/min    R Recommendations: See Admitting Provider Note  Report given to:   Additional Notes: Ambulatory, Aox4, continent. Condition has significantly improved since arrival.

## 2023-08-31 DIAGNOSIS — R0902 Hypoxemia: Secondary | ICD-10-CM

## 2023-08-31 LAB — BASIC METABOLIC PANEL
Anion gap: 12 (ref 5–15)
BUN: 14 mg/dL (ref 6–20)
CO2: 22 mmol/L (ref 22–32)
Calcium: 9.2 mg/dL (ref 8.9–10.3)
Chloride: 102 mmol/L (ref 98–111)
Creatinine, Ser: 0.66 mg/dL (ref 0.44–1.00)
GFR, Estimated: >60 mL/min (ref >60)
Glucose, Bld: 138 mg/dL — ABNORMAL HIGH (ref 70–99)
Potassium: 4.2 mmol/L (ref 3.5–5.1)
Sodium: 136 mmol/L (ref 135–145)

## 2023-08-31 LAB — CBC
HCT: 42.3 % (ref 36.0–46.0)
Hemoglobin: 14.4 g/dL (ref 12.0–15.0)
MCH: 32.7 pg (ref 26.0–34.0)
MCHC: 34 g/dL (ref 30.0–36.0)
MCV: 96.1 fL (ref 80.0–100.0)
Platelets: 264 10*3/uL (ref 150–400)
RBC: 4.4 MIL/uL (ref 3.87–5.11)
RDW: 12.5 % (ref 11.5–15.5)
WBC: 15.3 10*3/uL — ABNORMAL HIGH (ref 4.0–10.5)
nRBC: 0 % (ref 0.0–0.2)

## 2023-08-31 MED ORDER — ALBUTEROL SULFATE HFA 108 (90 BASE) MCG/ACT IN AERS
1.0000 | INHALATION_SPRAY | Freq: Four times a day (QID) | RESPIRATORY_TRACT | 3 refills | Status: AC | PRN
Start: 2023-08-31 — End: ?

## 2023-08-31 MED ORDER — PREDNISONE 20 MG PO TABS: ORAL_TABLET | ORAL | 0 refills | Status: DC

## 2023-08-31 MED ORDER — DM-GUAIFENESIN ER 30-600 MG PO TB12: 1.0000 | ORAL_TABLET | Freq: Two times a day (BID) | ORAL | 0 refills | Status: AC

## 2023-08-31 MED ORDER — DOXYCYCLINE HYCLATE 100 MG PO CAPS: 100.0000 mg | ORAL_CAPSULE | Freq: Two times a day (BID) | ORAL | 0 refills | Status: AC

## 2023-08-31 MED ORDER — BUDESONIDE-FORMOTEROL FUMARATE 160-4.5 MCG/ACT IN AERO: 2.0000 | INHALATION_SPRAY | Freq: Two times a day (BID) | RESPIRATORY_TRACT | 12 refills | Status: AC

## 2023-08-31 NOTE — Discharge Summary (Signed)
Physician Discharge Summary   Patient: Gabriella Vance MRN: [id number] DOB: May 10, 1966  Admit date:     08/30/2023  Discharge date: 08/31/23  Discharge Physician: Vassie Loll   PCP: Anabel Halon, MD   Recommendations at discharge:  Continue assisting patient arranging visit with pulmonologist as an outpatient for PFTs and to further adjust maintenance therapy of her COPD. Reassess blood pressure and adjust antihypertensive treatment as needed  Discharge Diagnoses: Principal Problem:   COPD exacerbation (HCC) Active Problems:   HTN (hypertension)   Mixed hyperlipidemia   Acute respiratory failure with hypoxia (HCC)   Allergic rhinitis   GERD (gastroesophageal reflux disease)   Hypoxia  Brief history of present illness: Gabriella Vance is a 57 y.o. female with medical history significant of COPD, allergy rhinitis, hypertension, hyperlipidemia and GERD; who presented to the hospital secondary to shortness of breath, increased wheezing and dyspnea exertion.  Patient reports symptom has been present for the last 3-4 days and worsening.  She can notice significant difficulty completing her activities of daily living and on the day of admission given expressed gasping for air and having difficulty catching her breath even at rest.  No fever, no chest pain, no nausea, no vomiting, no focal weaknesses, no dysuria, no hematuria, no sick contacts.   On arrival to the ED patient was found to be hypoxic on room air (saturation in the mid 80s), initially she in the requiring 4 L supplementation to maintain saturations above 90%.  Chest x-ray demonstrating significant emphysematous and bronchitic changes.  Stable vital signs and otherwise normal blood work.  Steroids and nebulizer treatments were provided.  Some improvement appreciated in her condition.  She was initially tried to go home and even left AGAINST MEDICAL ADVICE for a short period of time.  Unfortunately was still feeling very tight  and after arranging things at home she will return back for further evaluation, management and admission.     TRH consulted to place patient in the hospital for COPD exacerbation with acute respiratory failure and hypoxia  Assessment and Plan: * COPD exacerbation (HCC) -Very little expiratory wheezing appreciated at time of discharge; speaking in full sentences and not requiring oxygen supplementation.  Patient felt ready to go home. -Outpatient follow-up with pulmonologist for PFTs has been encourage -Discharge on steroids tapering, completion of oral doxycycline and resumption of as needed albuterol and twice a day maintenance Symbicort. -Prescription for mucolytic's using Mucinex-dextromethorphan and instruction for flutter valve has been provided.  GERD (gastroesophageal reflux disease) -With prior history of hiatal hernia -Protonix was provided while inpatient; patient will continue over-the-counter PPI as needed.  Allergic rhinitis -Continue the use of Allegra equivalent  -No significant nasal congestion appreciated on examination.  Acute respiratory failure with hypoxia (HCC) -On first arrival patient requiring 4 L to maintain saturation above 90%; 86-88% on room air per ED chart review. -Patient's hypoxemic event associated with COPD exacerbation -At time of discharge no oxygen supplementation was required and patient was able to speak in full sentences without difficulties.  Mixed hyperlipidemia -Heart healthy diet discussed with patient -Continue the use of Crestor.  HTN (hypertension) -Stable overall -Heart healthy diet discussed with patient -Continue prior to admission antihypertensive regimen.   Consultants: None Procedures performed: See below for x-ray reports. Disposition: Home Diet recommendation: Heart healthy diet.  DISCHARGE MEDICATION: Allergies as of 08/31/2023       Reactions   Latex Anaphylaxis, Shortness Of Breath, Rash        Medication  List      STOP taking these medications    fexofenadine 180 MG tablet Commonly known as: ALLEGRA   guaiFENesin 600 MG 12 hr tablet Commonly known as: MUCINEX   ibuprofen 200 MG tablet Commonly known as: ADVIL       TAKE these medications    albuterol 108 (90 Base) MCG/ACT inhaler Commonly known as: VENTOLIN HFA Inhale 1-2 puffs into the lungs every 6 (six) hours as needed for wheezing or shortness of breath. What changed: how much to take   atenolol 50 MG tablet Commonly known as: TENORMIN Take 1 tablet (50 mg total) by mouth daily.   azelastine 0.1 % nasal spray Commonly known as: ASTELIN Place 2 sprays into both nostrils 2 (two) times daily. Use in each nostril as directed   budesonide-formoterol 160-4.5 MCG/ACT inhaler Commonly known as: Symbicort Inhale 2 puffs into the lungs in the morning and at bedtime.   cetirizine 10 MG tablet Commonly known as: ZYRTEC Take 10 mg by mouth daily.   dextromethorphan-guaiFENesin 30-600 MG 12hr tablet Commonly known as: MUCINEX DM Take 1 tablet by mouth 2 (two) times daily.   doxycycline 100 MG capsule Commonly known as: VIBRAMYCIN Take 1 capsule (100 mg total) by mouth 2 (two) times daily for 5 days. One po bid x 7 days   gabapentin 100 MG capsule Commonly known as: NEURONTIN Take 2 capsules (200 mg total) by mouth at bedtime.   montelukast 10 MG tablet Commonly known as: SINGULAIR Take 1 tablet (10 mg total) by mouth at bedtime.   multivitamin with minerals Tabs tablet Take 1 tablet by mouth daily.   predniSONE 20 MG tablet Commonly known as: DELTASONE Take 3 tablets by mouth daily x 1 day; then 2 tablet by mouth daily x 2 days; then 1 tablet by mouth daily x 3 days; then half tablet by mouth daily x 3 days and stop prednisone. What changed: additional instructions   rosuvastatin 10 MG tablet Commonly known as: CRESTOR Take 1 tablet by mouth once daily        Follow-up Information     Anabel Halon, MD.  Schedule an appointment as soon as possible for a visit in 10 day(s).   Specialty: Internal Medicine Contact information: [address] [place] [address] [phone]         Nyoka Cowden, MD. Schedule an appointment as soon as possible for a visit in 4 week(s).   Specialty: Pulmonary Disease Contact information: [address] [place] [address] [phone]                Discharge Exam: General exam: Alert, awake, oriented x 3; in no acute distress.  Speaking in full sentences and demonstrating good saturation on room air. Respiratory system: Very little expiratory wheezing appreciated on exam.  No using accessory muscle. Cardiovascular system:RRR. No rubs or gallops; no JVD. Gastrointestinal system: Abdomen is nondistended, soft and nontender. No organomegaly or masses felt. Normal bowel sounds heard. Central nervous system: No focal neurological deficits. Extremities: No cyanosis or clubbing. Skin: No rashes, no petechiae. Psychiatry: Judgement and insight appear normal. Mood & affect appropriate.    Condition at discharge: Stable and improved.  The results of significant diagnostics from this hospitalization (including imaging, microbiology, ancillary and laboratory) are listed below for reference.   Imaging Studies: DG Chest 2 View  Result Date: 08/29/2023 CLINICAL DATA:  Short of breath for 2 weeks, COPD, productive cough EXAM: CHEST - 2 VIEW COMPARISON:  02/09/2021 FINDINGS: Frontal and lateral views of the chest demonstrate an unremarkable cardiac silhouette. Lungs are hyperinflated, with stable background emphysema. No acute airspace disease, effusion, or pneumothorax. No acute bony abnormalities. IMPRESSION: 1. Emphysema.  No acute airspace disease. Electronically Signed   By: Sharlet Salina M.D.   On: 08/29/2023 21:22    Microbiology: Results for orders placed or performed in visit on 04/03/22  Fecal occult blood, imunochemical     Status: None    Collection Time: 06/22/21 12:00 AM  Result Value Ref Range Status   IFOBT Negative  Final    Labs: CBC: Recent Labs  Lab 08/29/23 2129 08/31/23 0504  WBC 9.3 15.3*  HGB 14.9 14.4  HCT 44.0 42.3  MCV 95.7 96.1  PLT 257 264   Basic Metabolic Panel: Recent Labs  Lab 08/29/23 2129 08/30/23 0813 08/31/23 0504  NA 139  --  136  K 4.2  --  4.2  CL 106  --  102  CO2 24  --  22  GLUCOSE 91  --  138*  BUN 11  --  14  CREATININE 0.70  --  0.66  CALCIUM 8.9  --  9.2  MG  --  2.0  --   PHOS  --  3.1  --    Discharge time spent: greater than 30 minutes.  Signed: Vassie Loll, MD [facility] 08/31/2023

## 2023-08-31 NOTE — Progress Notes (Signed)
Removed IV-CDI. Reviewed d/c paperwork with patient and answered questions. Walked stable patient and belongings to ED entrance.

## 2023-08-31 NOTE — Plan of Care (Signed)

## 2023-08-31 NOTE — Progress Notes (Signed)
   08/31/23 1159  TOC Brief Assessment  Insurance and Status Lapsed  Patient has primary care physician Yes  Home environment has been reviewed spouse/sig other  Prior level of function: independent  Prior/Current Home Services No current home services  Social Determinants of Health Reivew SDOH reviewed no interventions necessary  Readmission risk has been reviewed Yes  Transition of care needs no transition of care needs at this time   Transition of Care Department [facility]) has reviewed patient and no TOC needs have been identified at this time. We will continue to monitor patient advancement through interdisciplinary progression rounds. If new patient transition needs arise, please place a TOC consult.

## 2023-08-31 NOTE — Plan of Care (Signed)
  Problem: Coping: Goal: Level of anxiety will decrease Outcome: Adequate for Discharge   Problem: Elimination: Goal: Will not experience complications related to bowel motility Outcome: Adequate for Discharge Goal: Will not experience complications related to urinary retention Outcome: Adequate for Discharge   Problem: Pain Managment: Goal: General experience of comfort will improve Outcome: Adequate for Discharge   Problem: Safety: Goal: Ability to remain free from injury will improve Outcome: Adequate for Discharge   Problem: Safety: Goal: Ability to remain free from injury will improve Outcome: Adequate for Discharge   Problem: Education: Goal: Knowledge of disease or condition will improve Outcome: Adequate for Discharge Goal: Knowledge of the prescribed therapeutic regimen will improve Outcome: Adequate for Discharge Goal: Individualized Educational Video(s) Outcome: Adequate for Discharge   Problem: Activity: Goal: Ability to tolerate increased activity will improve Outcome: Adequate for Discharge Goal: Will verbalize the importance of balancing activity with adequate rest periods Outcome: Adequate for Discharge   Problem: Respiratory: Goal: Ability to maintain a clear airway will improve Outcome: Adequate for Discharge Goal: Levels of oxygenation will improve Outcome: Adequate for Discharge Goal: Ability to maintain adequate ventilation will improve Outcome: Adequate for Discharge   Problem: Education: Goal: Knowledge of General Education information will improve Description: Including pain rating scale, medication(s)/side effects and non-pharmacologic comfort measures Outcome: Adequate for Discharge   Problem: Health Behavior/Discharge Planning: Goal: Ability to manage health-related needs will improve Outcome: Adequate for Discharge   Problem: Clinical Measurements: Goal: Ability to maintain clinical measurements within normal limits will  improve Outcome: Adequate for Discharge Goal: Will remain free from infection Outcome: Adequate for Discharge Goal: Diagnostic test results will improve Outcome: Adequate for Discharge Goal: Respiratory complications will improve Outcome: Adequate for Discharge Goal: Cardiovascular complication will be avoided Outcome: Adequate for Discharge   Problem: Activity: Goal: Risk for activity intolerance will decrease Outcome: Adequate for Discharge   Problem: Nutrition: Goal: Adequate nutrition will be maintained Outcome: Adequate for Discharge   Problem: Coping: Goal: Level of anxiety will decrease Outcome: Adequate for Discharge   Problem: Elimination: Goal: Will not experience complications related to bowel motility Outcome: Adequate for Discharge Goal: Will not experience complications related to urinary retention Outcome: Adequate for Discharge   Problem: Pain Managment: Goal: General experience of comfort will improve Outcome: Adequate for Discharge   Problem: Safety: Goal: Ability to remain free from injury will improve Outcome: Adequate for Discharge   Problem: Skin Integrity: Goal: Risk for impaired skin integrity will decrease Outcome: Adequate for Discharge

## 2023-09-03 ENCOUNTER — Telehealth: Payer: Self-pay

## 2023-09-03 NOTE — Telephone Encounter (Signed)
Transition Care Management Follow-up Telephone Call Date of discharge and from where: 08/31/23 [name] How have you been since you were released from the hospital? Feels great Any questions or concerns? No  Items Reviewed: Did the pt receive and understand the discharge instructions provided? Yes  Medications obtained and verified? Yes  Other? No  Any new allergies since your discharge? No  Dietary orders reviewed? No Do you have support at home? Yes   Home Care and Equipment/Supplies: Were home health services ordered? no  Functional Questionnaire: (I = Independent and D = Dependent) ADLs: I  Bathing/Dressing- I  Meal Prep- I  Eating- I  Maintaining continence- I  Transferring/Ambulation- I  Managing Meds- I  Follow up appointments reviewed:  PCP Hospital f/u appt confirmed? Yes  Scheduled to see Dr.Patel on 09/09/23 @ 1:00 pm. Specialist Hospital f/u appt confirmed? No   Are transportation arrangements needed? No  If their condition worsens, is the pt aware to call PCP or go to the Emergency Dept.? Yes Was the patient provided with contact information for the PCP's office or ED? Yes Was to pt encouraged to call back with questions or concerns? Yes

## 2023-09-09 ENCOUNTER — Ambulatory Visit: Payer: 59 | Admitting: Internal Medicine

## 2023-09-27 ENCOUNTER — Encounter (HOSPITAL_BASED_OUTPATIENT_CLINIC_OR_DEPARTMENT_OTHER): Payer: Self-pay

## 2023-10-04 ENCOUNTER — Other Ambulatory Visit: Payer: Self-pay | Admitting: Internal Medicine

## 2023-10-04 DIAGNOSIS — Z1212 Encounter for screening for malignant neoplasm of rectum: Secondary | ICD-10-CM

## 2023-10-04 DIAGNOSIS — Z1211 Encounter for screening for malignant neoplasm of colon: Secondary | ICD-10-CM

## 2023-12-18 ENCOUNTER — Ambulatory Visit: Payer: Self-pay | Admitting: Internal Medicine

## 2023-12-18 NOTE — Progress Notes (Unsigned)
   Established Patient Office Visit   Subjective  Patient ID: Gabriella Vance, female    DOB: 11/15/1966  Age: 57 y.o. MRN: [id number]  No chief complaint on file.   She  has a past medical history of Arthritis, COPD (chronic obstructive pulmonary disease) (HCC), and Hypertension.  Patient complains of {pneum rfv:14244}. Patient describes symptoms of {pneumonia symptoms:12520}. Symptoms began {0-10:33138} {unit:11::"days"} ago and are {course:17::"unchanged"} since that time. Patient denies {pneumonia denies:14121}. Treatment thus far includes {pneumonia tx to date:14122} Past pulmonary history is significant for {resp history:412}     ROS    Objective:     There were no vitals taken for this visit. {Vitals History (Optional):23777}  Physical Exam   No results found for any visits on 12/19/23.  The 10-year ASCVD risk score (Arnett DK, et al., 2019) is: 3.6%    Assessment & Plan:  There are no diagnoses linked to this encounter.  No follow-ups on file.   Cruzita Lederer Newman Nip, FNP

## 2023-12-18 NOTE — Patient Instructions (Signed)

## 2023-12-18 NOTE — Telephone Encounter (Signed)
Copied from CRM [id number]. Topic: Appointments - Appointment Scheduling >> Dec 18, 2023  1:25 PM Monisha R wrote: Patient/patient representative is calling to schedule an appointment. Refer to attachments for appointment information.    Chief Complaint: Coughing/illness not improving Symptoms: Coughing fits, congestion, "clear maybe a little yellow" sputum, "head is totally full of snot," trouble breathing Frequency: Intermittent coughing fits, constant congestion/runny nose Pertinent Negatives: Patient denies fever, struggling to breathe Disposition: [x] ED /[] Urgent Care (no appt availability in office) / [] Appointment(In office/virtual)/ []  [facility]/ [] Home Care/ [] Refused Recommended Disposition /[] [facility]/ []  Follow-up with PCP Additional Notes: Pt reporting illness and cough started "almost 2 weeks ago." Pt reporting she had virtual UC visit and was prescribed doxycycline but "didn't do any good," pt reporting she usually needs more than antibx to get rid of acute respiratory illness, "antibiotics don't work without steroids and breathing treatments for me." Pt reporting intermittent coughing fits and trouble breathing from cough, "cough my brains out." Pt reporting phlegm is "clear maybe a little bit yellow," pt denies blood. Pt reporting her "head is totally full of snot," enough snot to be "made by 15 people." Pt reporting she "imagine oxygen probably low," confirms gets out of breath with exertion, "cough my brains out." Pt reporting that she has an "albuterol puffer" and "another puffer from the hospital, Jerral Ralph" that was prescribed to her when she had 2-day hospital stay for pneumonia 2 months ago. Pt reporting she "had fever at beginning" of illness, but none now. Per pt chart, hx of HTN. 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 Pt reporting  trouble with coughing fits and breathing despite using inhalers. Pt denies struggling for each breath at this time. Pt started phone call with fuller sentences, now more so speaking in phrases. As phone call goes on, pt starts having profuse coughing fits on phone call, having a hard time getting words across. Advised pt go to ED since struggling more with breathing, pt confirms that this has been "me lately" not just with talking right now. Pt confirms she has someone who can take her to the ED and will go there shortly.  Reason for Disposition  [1] MODERATE difficulty breathing (e.g., speaks in phrases, SOB even at rest, pulse 100-120) AND [2] still present when not coughing  Answer Assessment - Initial Assessment Questions 1. ONSET: "When did the cough begin?"      Pt reporting illness and cough started "almost 2 weeks ago." 2. SEVERITY: "How bad is the cough today?"      Pt reporting intermittent coughing fits and trouble breathing from cough, "cough my brains out." As phone call goes on, pt starts having profuse coughing fits on phone call, having a hard time getting words across. 3. SPUTUM: "Describe the color of your sputum" (none, dry cough; clear, white, yellow, green)     Pt reporting phlegm is "clear maybe a little bit yellow" 4. HEMOPTYSIS: "Are you coughing up any blood?" If so ask: "How much?" (flecks, streaks, tablespoons, etc.)     Pt denies 5. DIFFICULTY BREATHING: "Are you having difficulty breathing?" If Yes, ask: "How bad is it?" (e.g., mild, moderate, severe)    - MILD: No SOB at rest, mild SOB with walking, speaks normally in sentences, can lie down, no retractions, pulse < 100.    - MODERATE: SOB at  rest, SOB with minimal exertion and prefers to sit, cannot lie down flat, speaks in phrases, mild retractions, audible wheezing, pulse 100-120.    - SEVERE: Very SOB at rest, speaks in single words, struggling to breathe, sitting hunched forward, retractions, pulse > 120      Pt  reporting she "imagine oxygen probably low," confirms gets out of breath with exertion, "cough my brains out." Pt denies struggling for each breath at this time. 6. FEVER: "Do you have a fever?" If Yes, ask: "What is your temperature, how was it measured, and when did it start?"     Pt reporting she "had fever at beginning" of illness, but none now. 7. CARDIAC HISTORY: "Do you have any history of heart disease?" (e.g., heart attack, congestive heart failure)      Per pt chart, hx of HTN. 8. 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 10. OTHER SYMPTOMS: "Do you have any other symptoms?" (e.g., runny nose, wheezing, chest pain)       Pt reporting her "head is totally full of snot"  Protocols used: Cough - Acute Productive-A-AH

## 2023-12-19 ENCOUNTER — Encounter: Payer: Self-pay | Admitting: Family Medicine

## 2023-12-19 ENCOUNTER — Ambulatory Visit (INDEPENDENT_AMBULATORY_CARE_PROVIDER_SITE_OTHER): Payer: Self-pay | Admitting: Family Medicine

## 2023-12-19 VITALS — BP 109/62 | HR 66 | Ht 70.0 in

## 2023-12-19 DIAGNOSIS — J069 Acute upper respiratory infection, unspecified: Secondary | ICD-10-CM | POA: Insufficient documentation

## 2023-12-19 MED ORDER — PREDNISONE 20 MG PO TABS
20.0000 mg | ORAL_TABLET | Freq: Two times a day (BID) | ORAL | 0 refills | Status: AC
Start: 2023-12-19 — End: 2023-12-24

## 2023-12-19 MED ORDER — AZITHROMYCIN 250 MG PO TABS: ORAL_TABLET | ORAL | 0 refills | Status: AC

## 2023-12-19 NOTE — Assessment & Plan Note (Signed)
 Azithromycin 250 mg twice daily x 5 days,  Prednisone 20 mg twice day x 5 days. Advise patient to rest to support your body's recovery. Stay hydrated by drinking water, tea, or broth. Using a humidifier can help soothe throat irritation and ease nasal congestion. For fever or pain, acetaminophen (Tylenol) is recommended. To relieve other symptoms, try saline nasal sprays, throat lozenges, or gargling with saltwater. Focus on eating light, healthy meals like fruits and vegetables to keep your strength up. Practice good hygiene by washing your hands frequently and covering your mouth when coughing or sneezing.Follow-up for worsening or persistent symptoms. Patient verbalizes understanding regarding plan of care and all questions answered

## 2024-03-05 ENCOUNTER — Other Ambulatory Visit: Payer: Self-pay | Admitting: Internal Medicine

## 2024-03-05 DIAGNOSIS — I10 Essential (primary) hypertension: Secondary | ICD-10-CM

## 2024-03-05 DIAGNOSIS — Z8739 Personal history of other diseases of the musculoskeletal system and connective tissue: Secondary | ICD-10-CM
